# Patient Record
Sex: Female | Born: 1970 | Hispanic: Yes | Marital: Married | Smoking: Former smoker
Health system: Southern US, Community
[De-identification: ages and names within clinical notes are randomized; demographics above are authoritative.]

## PROBLEM LIST (undated history)

## (undated) DIAGNOSIS — F1421 Cocaine dependence, in remission: Secondary | ICD-10-CM

## (undated) DIAGNOSIS — J45909 Unspecified asthma, uncomplicated: Secondary | ICD-10-CM

## (undated) DIAGNOSIS — I10 Essential (primary) hypertension: Secondary | ICD-10-CM

## (undated) DIAGNOSIS — G5791 Unspecified mononeuropathy of right lower limb: Secondary | ICD-10-CM

## (undated) DIAGNOSIS — R5383 Other fatigue: Secondary | ICD-10-CM

## (undated) DIAGNOSIS — K649 Unspecified hemorrhoids: Secondary | ICD-10-CM

## (undated) DIAGNOSIS — G4730 Sleep apnea, unspecified: Secondary | ICD-10-CM

## (undated) DIAGNOSIS — J302 Other seasonal allergic rhinitis: Secondary | ICD-10-CM

## (undated) DIAGNOSIS — I517 Cardiomegaly: Secondary | ICD-10-CM

## (undated) DIAGNOSIS — R011 Cardiac murmur, unspecified: Secondary | ICD-10-CM

## (undated) DIAGNOSIS — G43909 Migraine, unspecified, not intractable, without status migrainosus: Secondary | ICD-10-CM

## (undated) DIAGNOSIS — F419 Anxiety disorder, unspecified: Secondary | ICD-10-CM

## (undated) DIAGNOSIS — K219 Gastro-esophageal reflux disease without esophagitis: Secondary | ICD-10-CM

## (undated) DIAGNOSIS — O24419 Gestational diabetes mellitus in pregnancy, unspecified control: Secondary | ICD-10-CM

## (undated) DIAGNOSIS — N979 Female infertility, unspecified: Secondary | ICD-10-CM

## (undated) HISTORY — DX: Essential (primary) hypertension: I10

## (undated) HISTORY — DX: Unspecified asthma, uncomplicated: J45.909

## (undated) HISTORY — DX: Gastro-esophageal reflux disease without esophagitis: K21.9

## (undated) HISTORY — DX: Unspecified hemorrhoids: K64.9

## (undated) HISTORY — DX: Cardiac murmur, unspecified: R01.1

## (undated) HISTORY — DX: Other fatigue: R53.83

## (undated) HISTORY — DX: Sleep apnea, unspecified: G47.30

## (undated) HISTORY — DX: Cocaine dependence, in remission: F14.21

## (undated) HISTORY — DX: Other seasonal allergic rhinitis: J30.2

## (undated) HISTORY — DX: Gestational diabetes mellitus in pregnancy, unspecified control: O24.419

## (undated) HISTORY — DX: Anxiety disorder, unspecified: F41.9

## (undated) HISTORY — PX: LIPOSUCTION: SHX10

## (undated) HISTORY — DX: Migraine, unspecified, not intractable, without status migrainosus: G43.909

## (undated) HISTORY — PX: MOUTH SURGERY: SHX715

## (undated) HISTORY — DX: Female infertility, unspecified: N97.9

## (undated) HISTORY — PX: FOOT SURGERY: SHX648

---

## 1998-06-05 HISTORY — PX: TUBAL LIGATION: SHX77

## 2003-04-10 ENCOUNTER — Other Ambulatory Visit: Admission: RE | Admit: 2003-04-10 | Discharge: 2003-04-10 | Payer: Self-pay | Admitting: Family Medicine

## 2003-06-26 ENCOUNTER — Encounter: Admission: RE | Admit: 2003-06-26 | Discharge: 2003-06-26 | Payer: Self-pay | Admitting: Family Medicine

## 2003-07-03 ENCOUNTER — Encounter: Admission: RE | Admit: 2003-07-03 | Discharge: 2003-07-03 | Payer: Self-pay | Admitting: Family Medicine

## 2003-07-03 ENCOUNTER — Encounter (INDEPENDENT_AMBULATORY_CARE_PROVIDER_SITE_OTHER): Payer: Self-pay | Admitting: Specialist

## 2003-07-03 ENCOUNTER — Other Ambulatory Visit: Admission: RE | Admit: 2003-07-03 | Discharge: 2003-07-03 | Payer: Self-pay | Admitting: Family Medicine

## 2003-07-17 ENCOUNTER — Encounter: Admission: RE | Admit: 2003-07-17 | Discharge: 2003-07-17 | Payer: Self-pay | Admitting: Family Medicine

## 2003-08-16 ENCOUNTER — Emergency Department (HOSPITAL_COMMUNITY): Admission: EM | Admit: 2003-08-16 | Discharge: 2003-08-17 | Payer: Self-pay | Admitting: Emergency Medicine

## 2003-08-21 ENCOUNTER — Encounter: Admission: RE | Admit: 2003-08-21 | Discharge: 2003-08-21 | Payer: Self-pay | Admitting: Nurse Practitioner

## 2004-07-15 ENCOUNTER — Ambulatory Visit: Payer: Self-pay | Admitting: Nurse Practitioner

## 2004-07-26 ENCOUNTER — Ambulatory Visit: Payer: Self-pay | Admitting: Nurse Practitioner

## 2004-07-26 ENCOUNTER — Other Ambulatory Visit: Admission: RE | Admit: 2004-07-26 | Discharge: 2004-07-26 | Payer: Self-pay | Admitting: Family Medicine

## 2004-12-08 ENCOUNTER — Emergency Department (HOSPITAL_COMMUNITY): Admission: EM | Admit: 2004-12-08 | Discharge: 2004-12-08 | Payer: Self-pay | Admitting: Family Medicine

## 2005-01-27 ENCOUNTER — Ambulatory Visit: Payer: Self-pay | Admitting: Nurse Practitioner

## 2005-06-16 ENCOUNTER — Ambulatory Visit: Payer: Self-pay | Admitting: Nurse Practitioner

## 2005-08-21 ENCOUNTER — Other Ambulatory Visit: Admission: RE | Admit: 2005-08-21 | Discharge: 2005-08-21 | Payer: Self-pay | Admitting: Family Medicine

## 2005-08-21 ENCOUNTER — Ambulatory Visit: Payer: Self-pay | Admitting: Nurse Practitioner

## 2005-08-22 ENCOUNTER — Ambulatory Visit (HOSPITAL_COMMUNITY): Admission: RE | Admit: 2005-08-22 | Discharge: 2005-08-22 | Payer: Self-pay | Admitting: Nurse Practitioner

## 2005-09-21 ENCOUNTER — Ambulatory Visit: Payer: Self-pay | Admitting: Nurse Practitioner

## 2005-10-23 ENCOUNTER — Ambulatory Visit: Payer: Self-pay | Admitting: Nurse Practitioner

## 2005-12-04 ENCOUNTER — Ambulatory Visit: Payer: Self-pay | Admitting: Nurse Practitioner

## 2005-12-08 ENCOUNTER — Ambulatory Visit (HOSPITAL_COMMUNITY): Admission: RE | Admit: 2005-12-08 | Discharge: 2005-12-08 | Payer: Self-pay | Admitting: Family Medicine

## 2006-05-31 ENCOUNTER — Emergency Department (HOSPITAL_COMMUNITY): Admission: EM | Admit: 2006-05-31 | Discharge: 2006-06-01 | Payer: Self-pay | Admitting: Emergency Medicine

## 2006-08-21 ENCOUNTER — Ambulatory Visit: Payer: Self-pay | Admitting: Nurse Practitioner

## 2006-08-23 ENCOUNTER — Ambulatory Visit: Payer: Self-pay | Admitting: Nurse Practitioner

## 2006-08-26 ENCOUNTER — Emergency Department (HOSPITAL_COMMUNITY): Admission: EM | Admit: 2006-08-26 | Discharge: 2006-08-26 | Payer: Self-pay | Admitting: Emergency Medicine

## 2006-12-14 ENCOUNTER — Ambulatory Visit: Payer: Self-pay | Admitting: Internal Medicine

## 2007-04-25 ENCOUNTER — Other Ambulatory Visit: Admission: RE | Admit: 2007-04-25 | Discharge: 2007-04-25 | Payer: Self-pay | Admitting: Family Medicine

## 2007-04-25 ENCOUNTER — Encounter (INDEPENDENT_AMBULATORY_CARE_PROVIDER_SITE_OTHER): Payer: Self-pay | Admitting: Nurse Practitioner

## 2007-04-25 ENCOUNTER — Ambulatory Visit: Payer: Self-pay | Admitting: Family Medicine

## 2007-04-25 LAB — CONVERTED CEMR LAB
ALT: 17 units/L (ref 0–35)
AST: 17 units/L (ref 0–37)
Albumin: 4.3 g/dL (ref 3.5–5.2)
Alkaline Phosphatase: 65 units/L (ref 39–117)
BUN: 12 mg/dL (ref 6–23)
Basophils Absolute: 0 10*3/uL (ref 0.0–0.1)
Basophils Relative: 1 % (ref 0–1)
CO2: 23 meq/L (ref 19–32)
Calcium: 9.1 mg/dL (ref 8.4–10.5)
Chloride: 105 meq/L (ref 96–112)
Creatinine, Ser: 0.76 mg/dL (ref 0.40–1.20)
Eosinophils Absolute: 0.1 10*3/uL — ABNORMAL LOW (ref 0.2–0.7)
Eosinophils Relative: 2 % (ref 0–5)
Glucose, Bld: 74 mg/dL (ref 70–99)
HCT: 41.3 % (ref 36.0–46.0)
Hemoglobin: 13 g/dL (ref 12.0–15.0)
Lymphocytes Relative: 33 % (ref 12–46)
Lymphs Abs: 2.1 10*3/uL (ref 0.7–4.0)
MCHC: 31.5 g/dL (ref 30.0–36.0)
MCV: 92.2 fL (ref 78.0–100.0)
Monocytes Absolute: 0.4 10*3/uL (ref 0.1–1.0)
Monocytes Relative: 6 % (ref 3–12)
Neutro Abs: 3.7 10*3/uL (ref 1.7–7.7)
Neutrophils Relative %: 58 % (ref 43–77)
Platelets: 359 10*3/uL (ref 150–400)
Potassium: 4.1 meq/L (ref 3.5–5.3)
RBC: 4.48 M/uL (ref 3.87–5.11)
RDW: 12.5 % (ref 11.5–15.5)
Sodium: 140 meq/L (ref 135–145)
TSH: 0.692 microintl units/mL (ref 0.350–5.50)
Total Bilirubin: 0.3 mg/dL (ref 0.3–1.2)
Total Protein: 7.4 g/dL (ref 6.0–8.3)
WBC: 6.4 10*3/uL (ref 4.0–10.5)

## 2007-04-26 ENCOUNTER — Encounter (INDEPENDENT_AMBULATORY_CARE_PROVIDER_SITE_OTHER): Payer: Self-pay | Admitting: Nurse Practitioner

## 2008-03-31 ENCOUNTER — Emergency Department (HOSPITAL_COMMUNITY): Admission: EM | Admit: 2008-03-31 | Discharge: 2008-03-31 | Payer: Self-pay | Admitting: Emergency Medicine

## 2009-06-05 HISTORY — PX: FLUOROSCOPIC TUBAL RECANNULATUON: SHX6654

## 2009-08-31 ENCOUNTER — Telehealth: Payer: Self-pay | Admitting: Psychology

## 2009-08-31 ENCOUNTER — Ambulatory Visit: Payer: Self-pay | Admitting: Family Medicine

## 2009-08-31 DIAGNOSIS — G4700 Insomnia, unspecified: Secondary | ICD-10-CM

## 2009-08-31 DIAGNOSIS — J45909 Unspecified asthma, uncomplicated: Secondary | ICD-10-CM | POA: Insufficient documentation

## 2009-09-08 ENCOUNTER — Encounter: Payer: Self-pay | Admitting: Family Medicine

## 2009-09-08 ENCOUNTER — Telehealth: Payer: Self-pay | Admitting: Psychology

## 2009-09-08 ENCOUNTER — Ambulatory Visit: Payer: Self-pay | Admitting: Family Medicine

## 2009-09-08 ENCOUNTER — Other Ambulatory Visit: Admission: RE | Admit: 2009-09-08 | Discharge: 2009-09-08 | Payer: Self-pay | Admitting: Family Medicine

## 2009-09-08 DIAGNOSIS — K649 Unspecified hemorrhoids: Secondary | ICD-10-CM | POA: Insufficient documentation

## 2009-09-08 DIAGNOSIS — E6609 Other obesity due to excess calories: Secondary | ICD-10-CM

## 2009-09-08 LAB — CONVERTED CEMR LAB: Pap Smear: NEGATIVE

## 2009-09-12 ENCOUNTER — Encounter: Payer: Self-pay | Admitting: Family Medicine

## 2009-09-22 ENCOUNTER — Ambulatory Visit: Payer: Self-pay | Admitting: Family Medicine

## 2009-09-22 ENCOUNTER — Emergency Department (HOSPITAL_COMMUNITY): Admission: EM | Admit: 2009-09-22 | Discharge: 2009-09-22 | Payer: Self-pay | Admitting: Emergency Medicine

## 2009-09-23 ENCOUNTER — Encounter: Payer: Self-pay | Admitting: Family Medicine

## 2009-09-29 ENCOUNTER — Encounter: Payer: Self-pay | Admitting: Family Medicine

## 2009-09-29 ENCOUNTER — Ambulatory Visit: Payer: Self-pay | Admitting: Family Medicine

## 2009-09-29 DIAGNOSIS — F1421 Cocaine dependence, in remission: Secondary | ICD-10-CM

## 2009-09-29 DIAGNOSIS — F4310 Post-traumatic stress disorder, unspecified: Secondary | ICD-10-CM

## 2009-09-29 HISTORY — DX: Cocaine dependence, in remission: F14.21

## 2009-09-29 LAB — CONVERTED CEMR LAB
ALT: 11 units/L (ref 0–35)
AST: 15 units/L (ref 0–37)
Albumin: 4 g/dL (ref 3.5–5.2)
Alkaline Phosphatase: 65 units/L (ref 39–117)
BUN: 10 mg/dL (ref 6–23)
CO2: 24 meq/L (ref 19–32)
Calcium: 8.9 mg/dL (ref 8.4–10.5)
Chloride: 102 meq/L (ref 96–112)
Cholesterol: 169 mg/dL (ref 0–200)
Creatinine, Ser: 0.69 mg/dL (ref 0.40–1.20)
Glucose, Bld: 121 mg/dL — ABNORMAL HIGH (ref 70–99)
HCT: 38.3 % (ref 36.0–46.0)
HDL: 40 mg/dL (ref >39)
Hemoglobin: 12.5 g/dL (ref 12.0–15.0)
LDL Cholesterol: 92 mg/dL (ref 0–99)
MCHC: 32.6 g/dL (ref 30.0–36.0)
MCV: 91 fL (ref 78.0–100.0)
Platelets: 338 10*3/uL (ref 150–400)
Potassium: 3.8 meq/L (ref 3.5–5.3)
RBC: 4.21 M/uL (ref 3.87–5.11)
RDW: 12.9 % (ref 11.5–15.5)
Sodium: 139 meq/L (ref 135–145)
TSH: 1.04 microintl units/mL (ref 0.350–4.500)
Total Bilirubin: 0.2 mg/dL — ABNORMAL LOW (ref 0.3–1.2)
Total CHOL/HDL Ratio: 4.2
Total Protein: 7 g/dL (ref 6.0–8.3)
Triglycerides: 185 mg/dL — ABNORMAL HIGH (ref <150)
VLDL: 37 mg/dL (ref 0–40)
WBC: 5.1 10*3/uL (ref 4.0–10.5)

## 2009-10-04 ENCOUNTER — Emergency Department (HOSPITAL_COMMUNITY): Admission: EM | Admit: 2009-10-04 | Discharge: 2009-10-04 | Payer: Self-pay | Admitting: Family Medicine

## 2009-10-06 ENCOUNTER — Telehealth: Payer: Self-pay | Admitting: Psychology

## 2009-10-06 ENCOUNTER — Telehealth: Payer: Self-pay | Admitting: Family Medicine

## 2009-10-06 ENCOUNTER — Encounter: Payer: Self-pay | Admitting: Family Medicine

## 2009-10-13 ENCOUNTER — Encounter: Payer: Self-pay | Admitting: Psychology

## 2009-10-13 ENCOUNTER — Encounter: Payer: Self-pay | Admitting: Family Medicine

## 2009-10-14 ENCOUNTER — Telehealth (INDEPENDENT_AMBULATORY_CARE_PROVIDER_SITE_OTHER): Payer: Self-pay | Admitting: *Deleted

## 2009-10-14 ENCOUNTER — Encounter: Payer: Self-pay | Admitting: *Deleted

## 2009-10-20 ENCOUNTER — Telehealth: Payer: Self-pay | Admitting: Psychology

## 2009-10-27 ENCOUNTER — Encounter: Payer: Self-pay | Admitting: Psychology

## 2009-11-03 ENCOUNTER — Ambulatory Visit: Payer: Self-pay | Admitting: Psychology

## 2009-11-10 ENCOUNTER — Encounter: Payer: Self-pay | Admitting: Psychology

## 2009-11-24 ENCOUNTER — Telehealth: Payer: Self-pay | Admitting: Family Medicine

## 2009-11-24 ENCOUNTER — Encounter: Payer: Self-pay | Admitting: Psychology

## 2009-12-01 ENCOUNTER — Encounter: Payer: Self-pay | Admitting: Psychology

## 2009-12-15 ENCOUNTER — Ambulatory Visit: Payer: Self-pay | Admitting: Family Medicine

## 2009-12-15 ENCOUNTER — Encounter: Payer: Self-pay | Admitting: Family Medicine

## 2009-12-15 ENCOUNTER — Encounter: Payer: Self-pay | Admitting: Psychology

## 2009-12-15 LAB — CONVERTED CEMR LAB
Bilirubin Urine: NEGATIVE
Blood in Urine, dipstick: NEGATIVE
Glucose, Urine, Semiquant: NEGATIVE
Ketones, urine, test strip: NEGATIVE
Nitrite: NEGATIVE
Specific Gravity, Urine: >=1.03
Urobilinogen, UA: 0.2
WBC Urine, dipstick: NEGATIVE
Whiff Test: NEGATIVE
pH: 5.5

## 2009-12-20 ENCOUNTER — Telehealth: Payer: Self-pay | Admitting: Family Medicine

## 2009-12-20 ENCOUNTER — Telehealth: Payer: Self-pay | Admitting: *Deleted

## 2010-01-06 ENCOUNTER — Encounter: Payer: Self-pay | Admitting: Psychology

## 2010-01-06 ENCOUNTER — Encounter: Payer: Self-pay | Admitting: Family Medicine

## 2010-01-12 ENCOUNTER — Ambulatory Visit: Payer: Self-pay | Admitting: Family Medicine

## 2010-03-10 ENCOUNTER — Emergency Department (HOSPITAL_COMMUNITY): Admission: EM | Admit: 2010-03-10 | Discharge: 2010-03-11 | Payer: Self-pay | Admitting: Emergency Medicine

## 2010-03-22 ENCOUNTER — Telehealth: Payer: Self-pay | Admitting: *Deleted

## 2010-03-23 ENCOUNTER — Encounter: Payer: Self-pay | Admitting: Family Medicine

## 2010-03-30 ENCOUNTER — Telehealth: Payer: Self-pay | Admitting: Psychology

## 2010-04-04 ENCOUNTER — Telehealth: Payer: Self-pay | Admitting: Psychology

## 2010-04-05 ENCOUNTER — Ambulatory Visit: Payer: Self-pay | Admitting: Family Medicine

## 2010-04-05 DIAGNOSIS — F4321 Adjustment disorder with depressed mood: Secondary | ICD-10-CM

## 2010-04-15 ENCOUNTER — Ambulatory Visit: Payer: Self-pay | Admitting: Family Medicine

## 2010-07-05 NOTE — Progress Notes (Signed)
Summary: needs note  Phone Note Call from Patient Call back at Home Phone [phone]   Caller: Patient Summary of Call: needs something on letter head stating that she is being seen by Dr Pascal Lux and is continuing care with her. needs this by tomorrow if at all possible. Initial call taken by: De Nurse,  Oct 14, 2009 3:02 PM  Follow-up for Phone Call        patient notified that note is ready for pick up. Follow-up by: Theresia Lo RN,  Oct 14, 2009 3:37 PM
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
## 2010-07-05 NOTE — Progress Notes (Signed)
Summary: Reschedule therapy appt  Phone Note Call from Patient   Caller: Patient Call For: [name] [place] Student) Summary of Call: Michelle Trevino called the [facility] clinic and left a message to cancel her appt with Student Therapist [name].  They rescheduled for the 25th. Initial call taken by: Spero Geralds PsyD,  Oct 20, 2009 1:45 PM
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
## 2010-07-05 NOTE — Letter (Signed)
Summary: Generic Letter  [facility]  [address]   [place], [place] [address]   Phone: [phone]  Fax: [phone]    10/14/2009  [facility] [address] [place], [place]  [address]   To Whom it may Concern :        This is to verify that the above named patient is under the care of Dr.Michelle Pascal Lux, Warden/ranger, at [facility]. A follow up appointment is scheduled.           Sincerely,   Theresia Lo RN

## 2010-07-05 NOTE — Assessment & Plan Note (Signed)
Summary: [facility] Follow-up   Primary Care Provider:  Delbert Harness MD   History of Present Illness: See A/P.  Allergies: No Known Drug Allergies   Impression & Recommendations:  Problem # 1:  PTSD (ICD-309.81) Patient had an appt scheduled with her therapist and with [facility] at the same time.  She met with her therapist.  She was not started on any medicine during her initial [facility] so there were no med issues to follow.  Will discuss with her therapist and call the patient to reschedule if medication is desired / warranted. Orders: Provider Misc Charge- [facility] (Misc)  Complete Medication List: 1)  Ventolin Hfa 108 (90 Base) Mcg/act Aers (Albuterol sulfate) .... 2 puffs every 4 hours as needed for sob or wheezing. 2)  Butalbital-apap-caffeine 50-325-40 Mg Caps (Butalbital-apap-caffeine) .... One tab every 6 hours as needed for migraine 3)  Sumatriptan Succinate 50 Mg Tabs (Sumatriptan succinate) .... Take one tablet at the start of migraine headache.  may repeat once in 2 hours if needed. 4)  Naprosyn 500 Mg Tabs (Naproxen) .... One tab by mouth two times a day as needed for shoulder pain.

## 2010-07-05 NOTE — Progress Notes (Signed)
  Phone Note Outgoing Call   Call placed by: Jimmy Footman, CMA,  December 20, 2009 10:20 AM Summary of Call: Called pt. and informed her that no bacteria was found in urine. Marland KitchenJimmy Footman, CMA  December 20, 2009 10:22 AM

## 2010-07-05 NOTE — Progress Notes (Signed)
Summary: phn msg  Phone Note Call from Patient Call back at Home Phone [phone]   Caller: Patient Summary of Call: pt is trying to get pregnant and is on her period now - wants to know if she can get a rx for Clomid for the 3rd day of cycle to induce ovulation [facility] [address] Initial call taken by: De Nurse,  March 22, 2010 1:43 PM  Follow-up for Phone Call        i do not prescribe fertility medications.  I would be open to speaking with patient about need for referral.  Please have her schedule office visit if she would like to discuss further. Follow-up by: Delbert Harness MD,  March 22, 2010 5:30 PM  Additional Follow-up for Phone Call Additional follow up Details #1::        spoke with pt and she will call back and schedule an appt to speak with pcp Additional Follow-up by: Loralee Pacas CMA,  March 23, 2010 10:34 AM
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
## 2010-07-05 NOTE — Miscellaneous (Signed)
Summary: lab request from [facility]  Clinical Lists Changes rec'd a faxed request for stat labs from [facility]. I called & LM for pt to call me back. will need to be seen 1st as we do not usually do labs for other providers & her last OV here was in july & not for labs.Marland KitchenMarland KitchenGolden Circle RN  January 06, 2010 4:01 PM  she just called back. having suregery on 8/12. they need these labs done on the 19th & faxed on the 11th so the surgery can happen on the 12th. told pt to keep her appt here on the 9th & ask md to order the labs. if agreeable, she can then come back in the next am to get this done.form placed in pcp chart box.Golden Circle RN  January 06, 2010 4:03 PM  Labwork requested to be drawn on 01/12/10 for surgery on the 12th.  They would like Quant bHCG and CBC.  I am anot aware of any surgery.  I will discuss with patient when I see her at her appt. Delbert Harness MD  January 10, 2010 12:26 PM  Lab work was done at [facility] prior to the visit today. Pt understood that we do not order labs for other MD's and so she made an effort to ask the OB/GYN group to send her orders to [facility].  Jamie Brookes MD  January 12, 2010 10:17 AM

## 2010-07-05 NOTE — Assessment & Plan Note (Signed)
Summary: BV   Vital Signs:  Patient profile:   40 year old female Height:      61 inches Weight:      217.4 pounds BMI:     41.23 Temp:     99.5 degrees F oral Pulse rate:   73 / minute Pulse rhythm:   regular BP sitting:   119 / 87  (left arm) Cuff size:   large  Vitals Entered By: Loralee Pacas CMA (December 15, 2009 11:29 AM) CC: vaginal itching.  Comments  pt has has external itching and burning x 1 week.  stated that she used a cheap hair removal cream, and another type of soap.  she has no d/c just extreme itching. also she would like an RX for vaniqa cream   Primary Care Provider:  Delbert Harness MD  CC:  vaginal itching. Marland Kitchen  History of Present Illness: Vaginal Itching: pt has had some vaginal itching recently. She is a homosexual female in a stable relationship with no h/o STI's. She recently used a cheap hair removal lotion from the dollar store on the vaginal area. She had lots of redness and irritation at that time but then it cleared up. Recently she had her wisdom teeth extracted and is currently on antibiotics. She says it burns when she urinates and it itches when she sweats. No discharge.   Habits & Providers  Alcohol-Tobacco-Diet     Tobacco Status: current     Cigarette Packs/Day: 0.5  Current Medications (verified): 1)  Ventolin Hfa 108 (90 Base) Mcg/act Aers (Albuterol Sulfate) .... 2 Puffs Every 4 Hours As Needed For Sob or Wheezing. 2)  Butalbital-Apap-Caffeine 50-325-40 Mg Caps (Butalbital-Apap-Caffeine) .... One Tab Every 6 Hours As Needed For Migraine 3)  Naprosyn 500 Mg Tabs (Naproxen) .... One Tab By Mouth Two Times A Day As Needed For Shoulder Pain. 4)  Ibuprofen 600 Mg  Tabs (Ibuprofen) .... One By Mouth Q6h.  Please Take Regularly 5)  Hydrocodone-Acetaminophen 5-500 Mg Tabs (Hydrocodone-Acetaminophen) .... One By Mouth Q6h As Needed Pain Not Relieved By Ibuprofen 6)  Vaniqa 13.9 % Crea (Eflornithine Hcl) .... Apply To Face Twice A Day 8 Hours Apart. Will  Take 4-8 Hours To Work   1 Large Tube 7)  Calna  Tabs (Prenatal Vitamins) .... Take 1 Tablet Daily  Allergies (verified): No Known Drug Allergies  Social History: Unemployed.  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  Lives with partner and her two kids.  (other two kids are 18 and 20 and out of the house)  Smoker since age 42, 1/2  PPD, raer Etoh, no illicit drug use.  Starting Zumba and going to gym once a week. Going to have a tubal ligation reversal at [facility] and planning to get pregnant.   Review of Systems        vitals reviewed and pertinent negatives and positives seen in HPI   Physical Exam  General:  Well-developed,well-nourished,in no acute distress; alert,appropriate and cooperative throughout examination Genitalia:  Normal introitus for age, no external lesions, minimal vaginal discharge, mucosa pink and moist, no vaginal or cervical lesions, no vaginal atrophy, no friaility or hemorrhage, normal uterus size and position, no adnexal masses or tenderness Psych:  Cognition and judgment appear intact. Alert and cooperative with normal attention span and concentration. No apparent delusions, illusions, hallucinations   Impression & Recommendations:  Problem # 1:  DYSURIA (ICD-788.1) Assessment New  Pt is having some  burning when she urinates. She has been on antibiotics. she has a negative uA.   Orders:  [facility]- Est Level  3 (16109)  Her updated medication list for this problem includes:    Metronidazole 500 Mg Tabs (Metronidazole) .Marland Kitchen... 1 tab po two times a day for 7 days  Problem # 2:  PRURITUS (ICD-698.9) Assessment: New Pt found to have BV on wet prep. This is likely causing the puritis.   Orders: Wet Prep- [facility] (87210) [facility]- Est Level  3 (60454)  Complete Medication List: 1)  Ventolin Hfa 108 (90 Base) Mcg/act Aers (Albuterol sulfate) .... 2 puffs every 4 hours as needed for sob or wheezing. 2)   Butalbital-apap-caffeine 50-325-40 Mg Caps (Butalbital-apap-caffeine) .... One tab every 6 hours as needed for migraine 3)  Naprosyn 500 Mg Tabs (Naproxen) .... One tab by mouth two times a day as needed for shoulder pain. 4)  Ibuprofen 600 Mg Tabs (Ibuprofen) .... One by mouth q6h.  please take regularly 5)  Hydrocodone-acetaminophen 5-500 Mg Tabs (Hydrocodone-acetaminophen) .... One by mouth q6h as needed pain not relieved by ibuprofen 6)  Vaniqa 13.9 % Crea (Eflornithine hcl) .... Apply to face twice a day 8 hours apart. will take 4-8 hours to work   1 large tube 7)  Calna Tabs (Prenatal vitamins) .... Take 1 tablet daily 8)  Metronidazole 500 Mg Tabs (Metronidazole) .Marland Kitchen.. 1 tab po two times a day for 7 days  Other Orders: Urinalysis-[facility] (00000) Urine Culture-[facility] ([id number])  Patient Instructions: 1)  You have bacterial vaginosis.  2)  Good luck on your upcoming surgery.  3)  I hope the lotion works well for you.  Prescriptions: METRONIDAZOLE 500 MG TABS (METRONIDAZOLE) 1 tab Po two times a day for 7 days  #14 x 0   Entered and Authorized by:   Jamie Brookes MD   Signed by:   Jamie Brookes MD on 12/15/2009   Method used:   Electronically to        [facility] [phone]* (retail)       [address]       [place], [place]  [address]       Ph: [phone]       Fax: [phone]   RxID:   [id number] CALNA  TABS (PRENATAL VITAMINS) take 1 tablet daily  #31 x 6   Entered and Authorized by:   Jamie Brookes MD   Signed by:   Jamie Brookes MD on 12/15/2009   Method used:   Electronically to        [facility] [phone]* (retail)       [address]       [place], [place]  [address]       Ph: [phone]       Fax: [phone]   RxID:   [id number] VANIQA 13.9 % CREA (EFLORNITHINE HCL) apply to face twice a day 8 hours apart. will take 4-8 hours to work   1 large tube  #1 x 5   Entered and Authorized by:   Jamie Brookes MD   Signed by:   Jamie Brookes MD on  12/15/2009   Method used:   Electronically to        [facility] [phone]* (retail)       [address]       [place], [place]  [address]       Ph: [phone]       Fax: [phone]  RxID:   [id number] BUTALBITAL-APAP-CAFFEINE 50-325-40 MG CAPS (BUTALBITAL-APAP-CAFFEINE) one tab every 6 hours as needed for migraine  #30 x 3   Entered and Authorized by:   Jamie Brookes MD   Signed by:   Jamie Brookes MD on 12/15/2009   Method used:   Electronically to        [facility] [phone]* (retail)       [address]       [place], [place]  [address]       Ph: [phone]       Fax: [phone]   RxID:   [id number] VENTOLIN HFA 108 (90 BASE) MCG/ACT AERS (ALBUTEROL SULFATE) 2 puffs every 4 hours as needed for SOB or wheezing.  #1 x 5   Entered and Authorized by:   Jamie Brookes MD   Signed by:   Jamie Brookes MD on 12/15/2009   Method used:   Electronically to        [facility] [phone]* (retail)       [address]       [place], [place]  [address]       Ph: [phone]       Fax: [phone]   RxID:   [id number]   Laboratory Results   Urine Tests  Date/Time Received: December 15, 2009 11:34 AM  Date/Time Reported: December 15, 2009 11:44 AM   Routine Urinalysis   Color: yellow Appearance: Clear Glucose: negative   (Normal Range: Negative) Bilirubin: negative   (Normal Range: Negative) Ketone: negative   (Normal Range: Negative) Spec. Gravity: >=1.030   (Normal Range: 1.003-1.035) Blood: negative   (Normal Range: Negative) pH: 5.5   (Normal Range: 5.0-8.0) Protein: trace   (Normal Range: Negative) Urobilinogen: 0.2   (Normal Range: 0-1) Nitrite: negative   (Normal Range: Negative) Leukocyte Esterace: negative   (Normal Range: Negative)    Comments: ...........test performed by...........Marland KitchenTerese Door, CMA  Date/Time Received: December 15, 2009 11:34 PM  Date/Time Reported: December 15, 2009 12:12 PM   [employer] Source: vaginal WBC/hpf:  1-5 Bacteria/hpf: 3+  Cocci Clue cells/hpf: few  Negative whiff Yeast/hpf: many Trichomonas/hpf: none Comments: rod bacteria present also...........test performed by...........Marland KitchenTerese Door, CMA

## 2010-07-05 NOTE — Assessment & Plan Note (Signed)
Summary: sore throat,tcb   Vital Signs:  Patient profile:   40 year old female Weight:      217 pounds Temp:     99.1 degrees F oral Pulse rate:   88 / minute Pulse rhythm:   regular BP sitting:   122 / 80  (left arm) Cuff size:   regular  Vitals Entered By: Loralee Pacas CMA (September 22, 2009 2:55 PM) CC: sore throat, right arm pain Comments pt stated that she was around a lot of pollen this week noticed that she had "welts" on her tounge.  woke up this am and right arm was swollen, burning, painful   Primary Care Provider:  Delbert Harness MD  CC:  sore throat and right arm pain.  History of Present Illness: 40 y/o female c/o  sore throat- started Saturday. went to [place]. no known h/o allergic rhinitis. has had itching of eyes, feeling of something draining down throat, sore throat. no fever, chills, rhinorrhea, headaches. tried one dose of singulair with no relief.   right arm pain- this a.m. woke her from sleep. burning, ?swollen. resolved without intervention. no prior history of similar symptoms. no focal neurologic symptoms.   Habits & Providers  Alcohol-Tobacco-Diet     Tobacco Status: current     Tobacco Counseling: to quit use of tobacco products     Cigarette Packs/Day: 0.5  Current Medications (verified): 1)  Ventolin Hfa 108 (90 Base) Mcg/act Aers (Albuterol Sulfate) .... 2 Puffs Every 4 Hours As Needed For Sob or Wheezing. 2)  Butalbital-Apap-Caffeine 50-325-40 Mg Caps (Butalbital-Apap-Caffeine) .... One Tab Every 6 Hours As Needed For Migraine 3)  Sumatriptan Succinate 50 Mg Tabs (Sumatriptan Succinate) .... Take One Tablet At The Start of Migraine Headache.  May Repeat Once in 2 Hours If Needed. 4)  Naprosyn 500 Mg Tabs (Naproxen) .... One Tab By Mouth Two Times A Day As Needed For Shoulder Pain.  Allergies (verified): No Known Drug Allergies  Physical Exam  General:  obese female, NAD. vitals reviewed.  Eyes:  PERRLA/EOM intact; conjunctiva and sclera  clear Ears:  TMs intact and clear with normal canals and hearing Nose:  no deformity, discharge, inflammation, or lesions Mouth:  postnasal drip.   Neck:  no masses, thyromegaly, or abnormal cervical nodes Lungs:  clear bilaterally to A & P Heart:  regular rate and rhythm, S1, S2 without murmurs, rubs, gallops, or clicks Neurologic:  CN II-XII grossly intact. no focal weakness. normal gait   Shoulder/Elbow Exam  Shoulder Exam:    Right:    Inspection:  Normal    Palpation:  Normal    Stability:  stable    Tenderness:  no    Swelling:  no    Erythema:  no    Range of Motion:       Flexion-Active: 180       Extension-Active: 45       Flexion-Passive: 180       Extension-Passive: 45       External Rotation : 45       Interior Rotation : T7    Left:    Inspection:  Normal    Palpation:  Normal    Stability:  stable    Tenderness:  no    Swelling:  no    Erythema:  no    Range of Motion:       Flexion-Active: 180       Extension-Active: 45       Flexion-Passive: 180  Extension-Passive: 45       External Rotation : 45       Interior Rotation : T7   Impression & Recommendations:  Problem # 1:  POSTNASAL DRIP SYNDROME (ICD-784.91)  encouraged patient to try OTC allergy medication (cetirizine, loratadine, allegra). f/u with PCP. exam/hx not consistent with infectious etiology such as viral or bacterial pharyngitis.  Orders: [facility]- Est  Level 4 (29528)  Problem # 2:  SHOULDER PAIN, RIGHT (ICD-719.41) Assessment: New  likely 2/2 malpositioning while sleeping causing inflammation; naproxen (see pt instructions). f/u with PCP with further w/u if persists/returns.   Her updated medication list for this problem includes:    Butalbital-apap-caffeine 50-325-40 Mg Caps (Butalbital-apap-caffeine) ..... One tab every 6 hours as needed for migraine    Naprosyn 500 Mg Tabs (Naproxen) ..... One tab by mouth two times a day as needed for shoulder pain.  Orders: [facility]- Est   Level 4 (41324)  Patient Instructions: 1)  For the next two days, take naproxen one tablet twice a day no matter what.  2)  Try LORATADINE or CETIRIZINE (claritin, zyrtec) for your sore throat.  3)  Follow up with Dr. Earnest Bailey in 7-10 days.  Prescriptions: NAPROSYN 500 MG TABS (NAPROXEN) one tab by mouth two times a day as needed for shoulder pain.  #60 x 0   Entered and Authorized by:   Lequita Asal  MD   Signed by:   Lequita Asal  MD on 09/22/2009   Method used:   Electronically to        [facility] [phone]* (retail)       [address]       [place], [place]  [address]       Ph: [phone]       Fax: [phone]   RxID:   [id number]

## 2010-07-05 NOTE — Miscellaneous (Signed)
Summary: ROI  ROI   Imported By: Bradly Bienenstock 09/08/2009 10:57:45  _____________________________________________________________________  External Attachment:    Type:   Image     Comment:   External Document
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
## 2010-07-05 NOTE — Miscellaneous (Signed)
Summary: Behavioral Medicine Care  Clinical Lists Changes  Patient was followed by [name], [facility] Doctoral level psychology studnent.  Katie's placement at [facility] has come to a close.  She has discussed options with Kara Dies and Tanaysia expressed interest in following with Katie at [facility].  This is possible with her insurance.  Would encourage PCP to get a release of information to continue coordinated care with Ms. Leighton Parody.

## 2010-07-05 NOTE — Assessment & Plan Note (Signed)
Summary: FLU SHOT/[facility]  Nurse Visit  FLU SHOT GIVEN TODAY.Jimmy Footman, CMA  April 15, 2010 11:00 AM  Vital Signs:  Patient profile:   40 year old female Temp:     98.7 degrees F oral  Vitals Entered By: Jimmy Footman, CMA (April 15, 2010 10:59 AM)  Allergies: No Known Drug Allergies  Immunizations Administered:  Influenza Vaccine # 1:    Vaccine Type: Fluvax 3+    Site: left deltoid    Mfr: GlaxoSmithKline    Dose: 0.5 ml    Route: IM    Given by: Jimmy Footman, CMA    Exp. Date: 12/03/2010    Lot #: [id number]    VIS given: 12/28/09 version given April 15, 2010.  Flu Vaccine Consent Questions:    Do you have a history of severe allergic reactions to this vaccine? no    Any prior history of allergic reactions to egg and/or gelatin? no    Do you have a sensitivity to the preservative Thimersol? no    Do you have a past history of Guillan-Barre Syndrome? no    Do you currently have an acute febrile illness? no    Have you ever had a severe reaction to latex? no    Vaccine information given and explained to patient? yes    Are you currently pregnant? no  Orders Added: 1)  Flu Vaccine 5yrs + [90658] 2)  Admin 1st Vaccine [82956]

## 2010-07-05 NOTE — Assessment & Plan Note (Signed)
Summary: pap/eo   Vital Signs:  Patient profile:   40 year old female Weight:      214.6 pounds Temp:     98.8 degrees F oral Pulse rate:   82 / minute Pulse rhythm:   regular BP sitting:   128 / 91  Vitals Entered By: Loralee Pacas CMA (September 08, 2009 2:34 PM)  Primary Care Provider:  Delbert Harness MD  CC:  Gynecological exam.  History of Present Illness: 40 yo here for annual gynecological exam.  LMP:  Last week Contraception: BTL Regular Menses: yes Hx of Anemia: no FHx of Breast, Uterine, Cervical or Ovarian Cancer:  maternal grandmother with unknonw "female cancer" Last Pap:  2 years ago, was normal. Hx of Abnormal Pap:  was abnormal and was told to come back in 6 months.  Had a colposcopy in 2007, never found out results.  Desires STD testing: No Last Mammogram:  6 years ago, dx as fibrocystic changes, Abnormalities on Self-exam:  No Hx of Abnormal Mammogram:  she assumes it was ok.   hemorhoids:  history of hemorrhoids after childbirth.  has not been a problem in many years.  notes soreness around rectum.  Thinks hemorrhoids may have returned.    PMH-FH-SH reviewed-no changes except otherwise noted  Review of Systems      See HPI General:  Denies fever and weight loss. GI:  Complains of hemorrhoids; denies bloody stools, change in bowel habits, and constipation. GU:  Denies abnormal vaginal bleeding, discharge, dysuria, and genital sores.  Physical Exam  General:  Obese,well-nourished,in no acute distress; alert,appropriate and cooperative throughout examination Rectal:  no anal fissure.  good rectal tone. No obvious thrombosed hemorrhoid on digital rectal exam.  small old extertnal anal skin tag, but no obvious external hemorrhoid Genitalia:  Pelvic Exam:        External: normal female genitalia without lesions or masses        Vagina: normal without lesions or masses        Cervix: normal without lesions or masses        Adnexa: normal bimanual exam  without masses or fullness        Uterus: normal by palpation        Pap smear: performed   Impression & Recommendations:  Problem # 1:  ROUTINE GYNECOLOGICAL EXAMINATION (ICD-V72.31)  pap today.  History of abnormal pap s/p colposcopy 3-4 years ago.  [facility] in 2008 [facility] was normal.  Will continue regular surveillance.  Normal risk for breast cancer.  BTL for contraception.  Regular periods.  Future Orders: CBC-[facility] ([address]) ... 09/23/2010  Problem # 2:  HEMORRHOIDS (ICD-455.6)  Not very botherwsome to patient at this time.  Advised symptomatic treatment with preparation H, tucks.  No obvious anal tear or thrombosed hemorrhoid.  If continues to have discomfort, will perform anoscopy to evaluate in more depth for hemorrhoids  Future Orders: CBC[facility] ([address]) ... 09/23/2010  Problem # 3:  Preventive Health Care (ICD-V70.0) will draw fasting labs before next appointment and plan to discuss at next appointment with patient.  Of particular concern to patient are metabolic issues concerning history of ovarian cysts, hair growth.  Will check fasting lipids, glucose.  Complete Medication List: 1)  Ventolin Hfa 108 (90 Base) Mcg/act Aers (Albuterol sulfate) .... 2 puffs every 4 hours as needed for sob or wheezing. 2)  Butalbital-apap-caffeine 50-325-40 Mg Caps (Butalbital-apap-caffeine) .... One tab every 6 hours as needed for migraine 3)  Sumatriptan Succinate  50 Mg Tabs (Sumatriptan succinate) .... Take one tablet at the start of migraine headache.  may repeat once in 2 hours if needed.  Other Orders: Pap Smear-[facility] ([id number]) Future Orders: Comp Met-[facility] ([id number]) ... 09/23/2010 Lipid-[facility] ([id number]) ... 09/24/2010  Patient Instructions: 1)  take a daily multivitamin 2)  make appointment for lab draw (no eating after midnight the night before) 3)  Make follow-up appt after your blood draw to go over results and to discuss migraines and asthma and make sure the  medications you were on from your last doctor are the right ones for you. 4)  May try fiber foods, keeping stools soft, preparation H.  If pain continues, return for mroe thourough exam to look for hemorrhoids.  Appended Document: Orders Update    Clinical Lists Changes  Orders: Added new Test order of [facility]  18-39 yrs [phone]) - Signed

## 2010-07-05 NOTE — Progress Notes (Signed)
Summary: Discuss MDC appt  Phone Note Call from Patient   Caller: Patient Call For: Spero Geralds, Psy.D. Summary of Call: Patient called to see if there had been any cancellations for [facility].  She is scheduled for April 27th and is struggling.  She denies suicidal ideation.  Has been to the point where she has needed hospitalization in the past and is not there now.  She feels better today than yesterday.  Given history of mood issues and treatment, Dr. Earnest Bailey recommended specialist care through [facility] and I agree with this.  Patient values this approach as well but would like to be seen sooner.  Discussed two-pronged approach to therapy and recommened contacting [facility] for therapy.  Patient has social support so is hesitant.  Allowed room for her to decline and she elected to take the number.  Merrianne would likely be able to see her in the next week whereas I couldn't see her for several (hence the referral).  I will call if we have a cancellation for [facility] for next week. Initial call taken by: Spero Geralds PsyD,  September 08, 2009 8:57 AM

## 2010-07-05 NOTE — Progress Notes (Signed)
Summary: MDC follow-up  Phone Note Outgoing Call   Call placed by: Spero Geralds, Psy.D. Call placed to: Patient Summary of Call: Called to clarify [facility] follow-up.  She is planning on June 1st at 10:00.  Also called to give her information abuot herapy.  The [employer] student that is working with me will likely see her in the [facility].  I will discuss with her today and she will follow-up with Tabathia.    Finally - told her that Dr. Earnest Bailey is on maternity leave.  If she wants follow-up of her lab work, she can call to schedule with a team member. Initial call taken by: Spero Geralds PsyD,  Oct 06, 2009 10:53 AM

## 2010-07-05 NOTE — Progress Notes (Signed)
Summary: Requesting return to therapy   Phone Note Call from Patient   Caller: Patient Call For: Spero Geralds, Psy.D. Summary of Call: Patient called wanting to get in touch with her student therapist, Florentina Addison.  I told her I would send an email to Katie (initials only) and ask Florentina Addison to call her to discuss options.  Would be best for her to follow at [facility] if possible since Florentina Addison has the relationship with her.  Will follow-up with Katie to ensure patient gets follow-up. Initial call taken by: Spero Geralds PsyD,  March 30, 2010 4:46 PM

## 2010-07-05 NOTE — Miscellaneous (Signed)
Summary: Asthma,  intermittant  Clinical Lists Changes  Problems: Changed problem from ASTHMA, UNSPECIFIED, UNSPECIFIED STATUS (ICD-493.90) to ASTHMA, INTERMITTENT (ICD-493.90)

## 2010-07-05 NOTE — Progress Notes (Signed)
Summary: meds prob  Phone Note Call from Patient Call back at Home Phone [phone]   Caller: Patient Summary of Call: was referred to oral surgeon and can't get in for consult until 6/28 -  pt is in a lot of pain and needs enough pains to get her thru - dentist will not give any more.  Initial call taken by: De Nurse,  November 24, 2009 2:21 PM  Follow-up for Phone Call        to pcp Follow-up by: Golden Circle RN,  November 24, 2009 2:53 PM  Additional Follow-up for Phone Call Additional follow up Details #1::        I would be willing to prescribe pain meds once for her dental pain.  Another provider is in her chart and I am unable to add medications.  I am not in clinic tomorrow- would you please have another resident/preceptor write:    Ibuprofen 600 mg one tablet every 6 hours scheduled # 28.   For breakthrough pain Vicodin 5/325  one tablet every 6 hours for severe pain  #28.  Additional Follow-up by: Delbert Harness MD,  November 25, 2009 12:28 AM    Additional Follow-up for Phone Call Additional follow up Details #2::    will give to preceptor. she uses [facility] on [address] Follow-up by: Golden Circle RN,  November 25, 2009 8:05 AM  Additional Follow-up for Phone Call Additional follow up Details #3:: Details for Additional Follow-up Action Taken: Done.  Ibuprofen sent electronically,  Vicodin to be faxed. Additional Follow-up by: Doralee Albino MD,  November 25, 2009 10:45 AM  New/Updated Medications: IBUPROFEN 600 MG  TABS (IBUPROFEN) one by mouth q6h.  Please take regularly HYDROCODONE-ACETAMINOPHEN 5-500 MG TABS (HYDROCODONE-ACETAMINOPHEN) one by mouth q6h as needed pain not relieved by ibuprofen Prescriptions: HYDROCODONE-ACETAMINOPHEN 5-500 MG TABS (HYDROCODONE-ACETAMINOPHEN) one by mouth q6h as needed pain not relieved by ibuprofen  #28 x 0   Entered and Authorized by:   Doralee Albino MD   Signed by:   Doralee Albino MD on 11/25/2009   Method used:   Printed then  faxed to ...       [facility]  [address] [phone]* (retail)       [address]       [place], [place]  [address]       Ph: [phone]       Fax: [phone]   RxID:   [id number] IBUPROFEN 600 MG  TABS (IBUPROFEN) one by mouth q6h.  Please take regularly  #28 x 0   Entered and Authorized by:   Doralee Albino MD   Signed by:   Doralee Albino MD on 11/25/2009   Method used:   Electronically to        [facility] [phone]* (retail)       [address]       [place], [place]  [address]       Ph: [phone]       Fax: [phone]   RxID:   [id number]  faxed.Golden Circle RN  November 25, 2009 10:54 AM

## 2010-07-05 NOTE — Miscellaneous (Signed)
Summary: ROI  ROI   Imported By: Bradly Bienenstock 09/08/2009 10:57:22  _____________________________________________________________________  External Attachment:    Type:   Image     Comment:   External Document

## 2010-07-05 NOTE — Miscellaneous (Signed)
Summary: Records from [facility]  Clinical Lists Changes  Observations: Added new observation of FLUVAXDUE: 07/03/2009 (09/23/2009 10:29) Added new observation of TDBOOSTDUE: 01/03/2018 (09/23/2009 10:29) Added new observation of PAP DUE: 09/09/2010 (09/23/2009 10:29) Added new observation of PAST SURG HX: BTL 2000 Right foot xray 12/13/2007: 2.5 mm plantar calcaneal spur. ECHO 08/06/08 EF 55%, Normal LV chamber size and normal wall motion.  No valvular abn PFT: 03/17/2008: No obstruction/no restriction, no sig change in airflow after inhaled bronchodilator. (09/23/2009 10:29) Added new observation of PAST MED HX: Anxiety: Dx in 2000, rare panic attacks Hx of suicide attempts at age 34- drug overdose. 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 GERD Migraines Obesity Vitamin D deficiency 12.3 on July 2009 Hx of Gestational Diabetes Hx of ECHO to evaluate "heart enlargement" [id number]- 5 TAB's  Pneumovax: 07-03-2008 TDAP 12-13-2007 (09/23/2009 10:29) Added new observation of PRIMARY MD: Delbert Harness MD (09/23/2009 10:29) Added new observation of FLU VAX: given (07/03/2008 11:03) Added new observation of TD BOOSTER: TDAP given (01/04/2008 11:02)       Past History:  Past Medical History: Anxiety: Dx in 2000, rare panic attacks Hx of suicide attempts at age 40- drug overdose. 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 GERD Migraines Obesity Vitamin D deficiency 12.3 on July 2009 Hx of Gestational Diabetes Hx of ECHO to evaluate "heart enlargement" [id number]- 5 TAB's  Pneumovax: 07-03-2008 TDAP 12-13-2007  Past Surgical History: BTL 2000 Right foot xray 12/13/2007: 2.5 mm plantar calcaneal spur. ECHO 08/06/08 EF 55%, Normal LV chamber size and normal wall motion.  No valvular abn PFT: 03/17/2008: No obstruction/no restriction, no sig change in airflow after inhaled bronchodilator.  Flu Vaccine Result Date:   07/03/2008 Flu Vaccine Result:  given Flu Vaccine Next Due:  1 yr TD Result Date:  01/04/2008 TD Result:  TDAP given TD Next Due:  10 yr

## 2010-07-05 NOTE — Progress Notes (Signed)
Summary: triage  Phone Note Call from Patient Call back at Home Phone [phone]   Caller: Patient Summary of Call: Pt is wanting to be checked for other stds does she need to see doctor or does she just get lab work.  Pt under inpression she only needs lab work. Initial call taken by: Clydell Hakim,  December 20, 2009 11:03 AM  Follow-up for Phone Call        was recently seen by Dr. Clotilde Dieter for BV. wants to see her again & be checked for all STDs. explained the HIV & syphylis is a blood test. she is off wed & wants 11am appt. to see Strother at that tim Follow-up by: Golden Circle RN,  December 20, 2009 11:11 AM

## 2010-07-05 NOTE — Letter (Signed)
Summary: Results Follow-up Letter  [facility]  [address]   [place], [place] [address]   Phone: [phone]  Fax: [phone]    09/12/2009  [address] [place], [place]  [address]  Dear Ms. TATE,   The following are the results of your recent test(s):  Test     Result     Pap Smear    Normal      Sincerely,  Delbert Harness MD [facility]           Appended Document: Results Follow-up Letter mailed.

## 2010-07-05 NOTE — Miscellaneous (Signed)
Summary: Consent for Psycotherapy Services  Consent for Psycotherapy Services   Imported By: De Nurse 11/03/2009 16:24:06  _____________________________________________________________________  External Attachment:    Type:   Image     Comment:   External Document

## 2010-07-05 NOTE — Letter (Signed)
Summary: Generic Letter  [facility]  [address]   [place], [place] [address]   Phone: [phone]  Fax: [phone]    10/06/2009  [facility] TATE [address] [place], [place]  [address]  Dear Ms. TATE,  Here is a copy of your lab results.  Everything looked good.  Tests: (1) CBC NO Diff (Complete Blood Count) (10000)   Order Note: FASTING   WBC                       5.1 K/uL                    4.0-10.5   RBC                       4.21 MIL/uL                 3.87-5.11   Hemoglobin                12.5 g/dL                   57.8-46.9   Hematocrit                38.3 %                      36.0-46.0   MCV                       91.0 fL                     78.0-100.0 ! MCH                       29.7 pg                     26.0-34.0   MCHC                      32.6 g/dL                   62.9-52.8   RDW                       12.9 %                      11.5-15.5   Platelet Count            338 K/uL                    150-400  Tests: (2) Comprehensive Metabolic Panel ([address])   Sodium                    139 mEq/L                   135-145   Potassium                 3.8 mEq/L                   3.5-5.3   Chloride                  102 mEq/L                   96-112  CO2                       24 mEq/L                    19-32   Glucose              [H]  121 mg/dL                   59-56   BUN                       10 mg/dL                    3-87   Creatinine                0.69 mg/dL                  0.40-1.20   Bilirubin, Total     [L]  0.2 mg/dL                   5.6-4.3   Alkaline Phosphatase      65 U/L                      39-117   AST/SGOT                  15 U/L                      0-37   ALT/SGPT                  11 U/L                      0-35   Total Protein             7.0 g/dL                    3.2-9.5   Albumin                   4.0 g/dL                    1.8-8.4   Calcium                   8.9 mg/dL                   1.6-60.6  Tests: (3) Lipid  Profile ([address])   Cholesterol               169 mg/dL                   1-093     ATP III Classification:           < 200        mg/dL        Desirable          200 - 239     mg/dL        Borderline High          >= 240        mg/dL        High         Triglyceride         [H]  185 mg/dL                   <  150   HDL Cholesterol           40 mg/dL                    >16   Total Chol/HDL Ratio      4.2 Ratio  VLDL Cholesterol (Calc)                             37 mg/dL                    1-09  LDL Cholesterol (Calc)                             92 mg/dL                    6-04           Total Cholesterol/HDL Ratio:CHD Risk                            Coronary Heart Disease Risk Table                                            Men       Women              1/2 Average Risk              3.4        3.3                  Average Risk              5.0        4.4              2 X Average Risk              9.6        7.1              3 X Average Risk             23.4       11.0     Use the calculated Patient Ratio above and the CHD Risk table      to determine the patient's CHD Risk.     ATP III Classification (LDL):           < 100        mg/dL         Optimal   Sincerely,   Angelena Sole MD  Appended Document: Generic Letter mailed

## 2010-07-05 NOTE — Assessment & Plan Note (Signed)
Summary: Behavioral Medicine Student Consultation   Vital Signs:  Patient profile:   40 year old female Weight:      217.4 pounds Cuff size:   regular   Primary Care Provider:  Delbert Harness MD   History of Present Illness: I met with Bular for an individual therapy session lasting approximately 45 minutes (arrived late).  Shamyra has a history of trauma, spanning from childhood to adulthood.    Habits & Providers  Alcohol-Tobacco-Diet     Tobacco Status: current     Tobacco Counseling: to quit use of tobacco products     Cigarette Packs/Day: 0.5  Allergies: No Known Drug Allergies   Impression & Recommendations:  Problem # 1:  PTSD (ICD-309.81) At the start of the session, Caraline talked about her upcoming reversal tubal ligation surgery.  She is still optimistic about the surgery and her chances of conceiving.  I suggested that we try to meet the week prior to her surgery given the recovery that will follow.  However, I reminded Kieran that we will have to meet at the [facility] given that I will no longer be interning at [employer].  I suggested that she stop by the clinic in the next couple of weeks to complete the necessary paperwork.  Jeyla reported doing well over the past 2 weeks, with no additional anxiety attacks.  However, we did talk about symptoms of that attack and how to better address it should she have another one.  The remainder of the session was spent talking about the readings from the Prolonged Exposure for PTSD workbook regarding real-life experiments.  Dmiyah was able to identify situations associated with ratings of 0, 5, and 10 on the stress/anxiety thermometer, but had a difficult time identifying scenarios for her exposure hierarchy.  I suggested that she talk about the traumas more in session, rating her anxiety as she talks, given that she does not feel that she is avoiding people, places, or things in daily life that remind her of the trauma.  The one  scenario that she was able to identify for her hierarchy was speaking to her mother about her part in the traumas that she endured in childhood.  I suggested that Alyzae gradually increase her time spent talking to her mother (about other topics) in an attempt to become more comfortable addressing issues with her and to increase her tolerance of her mother, who typically irritates Jeselle very easily.  Atira created a rating scale for irritability, in addition to her scale for anxiety, to use when speaking to her mother.  She said that she would try this "experiment" over the next week and record her ratings.   Armida Sans  Behavioral Medicine Student  December 15, 2009 11:27 AM  Complete Medication List: 1)  Ventolin Hfa 108 (90 Base) Mcg/act Aers (Albuterol sulfate) .... 2 puffs every 4 hours as needed for sob or wheezing. 2)  Butalbital-apap-caffeine 50-325-40 Mg Caps (Butalbital-apap-caffeine) .... One tab every 6 hours as needed for migraine 3)  Sumatriptan Succinate 50 Mg Tabs (Sumatriptan succinate) .... Take one tablet at the start of migraine headache.  may repeat once in 2 hours if needed. 4)  Naprosyn 500 Mg Tabs (Naproxen) .... One tab by mouth two times a day as needed for shoulder pain. 5)  Ibuprofen 600 Mg Tabs (Ibuprofen) .... One by mouth q6h.  please take regularly 6)  Hydrocodone-acetaminophen 5-500 Mg Tabs (Hydrocodone-acetaminophen) .... One by mouth q6h as needed pain not relieved by  ibuprofen    Appended Document: Behavioral Medicine Student Consultation Read note and discussed case with Student [name].  Made some suggestions about gently addressing some concerns about moving forward with attempts to get pregnant as Kara Dies and her partner plan.

## 2010-07-05 NOTE — Progress Notes (Signed)
Summary: Schedule MDC  Phone Note Call from Patient   Caller: Patient Call For: Spero Geralds, Psy.D. Summary of Call: Patient called to schedule an appt.  She was most interested in a medication evaluation so I [facility] in [facility].  First available was April 27th at 11:00.  I will call her if we have a cancellation earlier.   Initial call taken by: Spero Geralds PsyD,  August 31, 2009 4:26 PM

## 2010-07-05 NOTE — Assessment & Plan Note (Signed)
Summary: New Patient,Michelle Trevino   Vital Signs:  Patient profile:   40 year old female Weight:      216.3 pounds Temp:     99.7 degrees F oral Pulse rate:   97 / minute BP sitting:   127 / 84  (left arm) Cuff size:   regular  Vitals Entered By: San Morelle, SMA CC: NP   CC:  NP.  History of Present Illness: here to establish primary care:Her children and aprtner are patients here at [facility]/  Has been out of meds for past 3-4 months.  Main concern today is anxiety.  Anxiety: Dx in 2000, has had previous suicide attempt.  Describes symptoms as chronic worrying- having to get up and check to see if door is locked.  Has agoraphobia, notes having recurrent thoughts of morbid things, death of loved ones.   Does not feel depressed- able to function well, enjoys doing things, eatigng, sleep ok.  No longer Celexa, lexapro (teeth grinding) , Buspar, Xanax, wellbutrin.  She did not feel any of these were helpful.  Has been off most recent med- wellbutrin for several months and feels no different.     Habits & Providers  Alcohol-Tobacco-Diet     Tobacco Status: current     Cigarette Packs/Day: 0.5  Current Medications (verified): 1)  Ventolin Hfa 108 (90 Base) Mcg/act Aers (Albuterol Sulfate) .... 2 Puffs Every 4 Hours As Needed For Sob or Wheezing. 2)  Butalbital-Apap-Caffeine 50-325-40 Mg Caps (Butalbital-Apap-Caffeine) .... One Tab Every 6 Hours As Needed For Migraine 3)  Sumatriptan Succinate 50 Mg Tabs (Sumatriptan Succinate) .... Take One Tablet At The Start of Migraine Headache.  May Repeat Once in 2 Hours If Needed.  Past History:  Past Medical History: Anxiety: Dx in 2000, rare panic attacks Hx of suicide attempts at age 69- drug overdose. Asthma- Dx in 2000 in the military by P[employer] and received disability for it. GERD Migraines Vitamin D deficiency Hx of ECHO to evaluate "heart enlargement" [id number]- 5 TAB's  Past Surgical History: BTL 2000 PMH-FH-SH reviewed-no changes except  otherwise noted  Family History: Mom: HTN, DM, RLS, Acute hepatitis, pituitary adenoma?, osteoarthritis, stroke age 72. Dad: Unknown cancer; not much known Siblings: Brother stroke age 52  Social History: Unemployed.  Thinking about starting a job at an abortion clinic. In school at [employer] for public health.  Lives with partner and her two kids.  (other two kids are 18 and 20 and out of the house)  Smoker since age 6, 1/2  PPD, raer Etoh, no illicit drug use.  Starting Zumba and goign to gym once a week.Smoking Status:  current Packs/Day:  0.5  Review of Systems      See HPI General:  Denies fever and weight loss. Eyes:  Denies blurring. ENT:  Denies decreased hearing. CV:  Denies chest pain or discomfort, difficulty breathing at night, fainting, lightheadness, shortness of breath with exertion, and swelling of feet. Resp:  Denies cough, shortness of breath, and wheezing. GI:  Denies abdominal pain, bloody stools, change in bowel habits, constipation, diarrhea, nausea, and vomiting. GU:  Denies dysuria. Neuro:  Denies numbness and tingling. Psych:  Complains of anxiety; denies depression, panic attacks, and suicidal thoughts/plans.  Physical Exam  General:  Obese,well-nourished,in no acute distress; alert,appropriate and cooperative throughout examination Neck:  No deformities, masses, or tenderness noted. Lungs:  Normal respiratory effort, chest expands symmetrically. Lungs are clear to auscultation, no crackles or wheezes. Heart:  Normal rate and regular rhythm. S1 and  S2 normal without gallop, murmur, click, rub or other extra sounds. Abdomen:  Bowel sounds positive,abdomen soft and non-tender without masses, organomegaly or hernias noted. Extremities:  No clubbing, cyanosis, edema, or deformity noted with normal full range of motion of all joints.   Psych:  Cognition and judgment appear intact. Alert and cooperative with normal attention span and concentration. No apparent  delusions, illusions, hallucinations   Impression & Recommendations:  Problem # 1:  ANXIETY STATE, UNSPECIFIED (ICD-300.00) History unclear if this is primarily anxiety spectrum disorder or if component of depression.  given multiple medication failures, will refer to [facility] for further evaluation.  Problem # 2:  INSOMNIA (ICD-780.52) Likely anxiety is large component.  Discussed sleep hygeine, trying benadryl as she works on sleep hygeine.  Will discuss in more detail at future visit.  Problem # 3:  ASTHMA, UNSPECIFIED, UNSPECIFIED STATUS (ICD-493.90) Will explore in further detail at next visit.  Well controlled at this time.  Will refill albuterol.  Her updated medication list for this problem includes:    Ventolin Hfa 108 (90 Base) Mcg/act Aers (Albuterol sulfate) .Marland Kitchen... 2 puffs every 4 hours as needed for sob or wheezing.  Problem # 4:  MIGRAINE HEADACHE (ICD-346.90) Will refill patient's fiorcet and sumatriptan today.  Her updated medication list for this problem includes:    Butalbital-apap-caffeine 50-325-40 Mg Caps (Butalbital-apap-caffeine) ..... One tab every 6 hours as needed for migraine    Sumatriptan Succinate 50 Mg Tabs (Sumatriptan succinate) .Marland Kitchen... Take one tablet at the start of migraine headache.  may repeat once in 2 hours if needed.  Problem # 5:  Preventive Health Care (ICD-V70.0)  Complete Medication List: 1)  Ventolin Hfa 108 (90 Base) Mcg/act Aers (Albuterol sulfate) .... 2 puffs every 4 hours as needed for sob or wheezing. 2)  Butalbital-apap-caffeine 50-325-40 Mg Caps (Butalbital-apap-caffeine) .... One tab every 6 hours as needed for migraine 3)  Sumatriptan Succinate 50 Mg Tabs (Sumatriptan succinate) .... Take one tablet at the start of migraine headache.  may repeat once in 2 hours if needed.  Patient Instructions: 1)  Nice to meet you! 2)  Call Dr. Pascal Lux to talk about anxiety. 3)  Try benadryl available OTC while you work on "sleep hygeine" 4)  Make  appointment for Pap smear- gynecological exam 5)  Make appointment to follow-up on Asthma Prescriptions: SUMATRIPTAN SUCCINATE 50 MG TABS (SUMATRIPTAN SUCCINATE) take one tablet at the start of migraine headache.  May repeat once in 2 hours if needed.  #8 x 1   Entered and Authorized by:   Delbert Harness MD   Signed by:   Delbert Harness MD on 09/01/2009   Method used:   Electronically to        [facility] [address] [phone]* (retail)       [address]       [place], [place]  [address]       Ph: [phone]       Fax: [phone]   RxID:   [id number] BUTALBITAL-APAP-CAFFEINE 50-325-40 MG CAPS [facility]) one tab every 6 hours as needed for migraine  #28 x 0   Entered and Authorized by:   Delbert Harness MD   Signed by:   Delbert Harness MD on 09/01/2009   Method used:   Electronically to        [facility] [address] [phone]* (retail)       [address]       [place], [place]  [address]  Ph: [phone]       Fax: [phone]   RxID:   [id number] VENTOLIN HFA 108 (90 BASE) MCG/ACT AERS (ALBUTEROL SULFATE) 2 puffs every 4 hours as needed for SOB or wheezing.  #1 x 3   Entered and Authorized by:   Delbert Harness MD   Signed by:   Delbert Harness MD on 09/01/2009   Method used:   Electronically to        [facility] [address] [phone]* (retail)       [address]       [place], [place]  [address]       Ph: [phone]       Fax: [phone]   RxID:   [id number]   Flu Vaccine Result Date:  03/05/2009 Flu Vaccine Next Due:  1 yr
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
## 2010-07-05 NOTE — Assessment & Plan Note (Signed)
Summary: puritis resolved/pre-pregnancy visit   Vital Signs:  Patient profile:   40 year old female Weight:      219.4 pounds Temp:     98.9 degrees F oral Pulse rate:   84 / minute Pulse rhythm:   regular BP sitting:   119 / 84  (right arm) Cuff size:   large  Vitals Entered By: Loralee Pacas CMA (January 12, 2010 8:42 AM) CC: Puritis resolved   Primary Care Provider:  Delbert Harness MD  CC:  Puritis resolved.  History of Present Illness: Puritis resolved/Pre-Pregnancy Visit: Pt is having a tubal ligation reversal on 01-14-10 at [facility]. She is taking prenatal vitamins. Her and her partner are hoping to get pregnant this year.  Pt had gestational diabetes on insulin two times a day wtih CBG's 5x a day.  Pt advised to stop smoking and not use NSAIDs those during pregnancy. Pt plans to see her MD here while she is pregnant. Will need early GTT. No fevers, chills, all urinary symptoms from last visit gone including itching, no burning.   Habits & Providers  Alcohol-Tobacco-Diet     Tobacco Status: current     Tobacco Counseling: to quit use of tobacco products     Cigarette Packs/Day: 0.5  Current Medications (verified): 1)  Ventolin Hfa 108 (90 Base) Mcg/act Aers (Albuterol Sulfate) .... 2 Puffs Every 4 Hours As Needed For Sob or Wheezing. 2)  Butalbital-Apap-Caffeine 50-325-40 Mg Caps (Butalbital-Apap-Caffeine) .... One Tab Every 6 Hours As Needed For Migraine 3)  Naprosyn 500 Mg Tabs (Naproxen) .... One Tab By Mouth Two Times A Day As Needed For Shoulder Pain. 4)  Ibuprofen 600 Mg  Tabs (Ibuprofen) .... One By Mouth Q6h.  Please Take Regularly 5)  Calna  Tabs (Prenatal Vitamins) .... Take 1 Tablet Daily  Allergies (verified): No Known Drug Allergies  Review of Systems        vitals reviewed and pertinent negatives and positives seen in HPI   Physical Exam  General:  Well-developed,well-nourished,in no acute distress; alert,appropriate and cooperative  throughout examination Psych:  Cognition and judgment appear intact. Alert and cooperative with normal attention span and concentration. No apparent delusions, illusions, hallucinations   Impression & Recommendations:  Problem # 1:  PRURITUS (ICD-698.9) Assessment Improved Pt's symptoms have resolved. She is going to a tubal ligation reversal in 2 days. her prior symptoms have all resolved. she is planning to come here for OB visits. She is planning this pregnancy with her homosexual partner. Plan to use her partners female friend as a natural donor.   Orders: [facility]- Est Level  3 ([address]213)  Complete Medication List: 1)  Ventolin Hfa 108 (90 Base) Mcg/act Aers (Albuterol sulfate) .... 2 puffs every 4 hours as needed for sob or wheezing. 2)  Butalbital-apap-caffeine 50-325-40 Mg Caps (Butalbital-apap-caffeine) .... One tab every 6 hours as needed for migraine 3)  Naprosyn 500 Mg Tabs (Naproxen) .... One tab by mouth two times a day as needed for shoulder pain. 4)  Ibuprofen 600 Mg Tabs (Ibuprofen) .... One by mouth q6h.  please take regularly 5)  Calna Tabs (Prenatal vitamins) .... Take 1 tablet daily

## 2010-07-05 NOTE — Progress Notes (Signed)
Summary: Lab Res  Phone Note Call from Patient Call back at Home Phone [phone]   Caller: Patient Summary of Call: Checking on lab work from last week. Initial call taken by: Clydell Hakim,  Oct 06, 2009 11:06 AM  Follow-up for Phone Call        will forward to MD Follow-up by: Theresia Lo RN,  Oct 06, 2009 11:24 AM  Additional Follow-up for Phone Call Additional follow up Details #1::        Lab results were normal.  Will send letter with results Additional Follow-up by: Angelena Sole MD,  Oct 06, 2009 11:37 AM

## 2010-07-25 ENCOUNTER — Encounter: Payer: Self-pay | Admitting: Family Medicine

## 2010-07-25 ENCOUNTER — Ambulatory Visit (HOSPITAL_COMMUNITY)
Admission: RE | Admit: 2010-07-25 | Discharge: 2010-07-25 | Disposition: A | Discharge status: Home / Self Care | Payer: Medicaid Other | Source: Ambulatory Visit | Attending: Cardiovascular Disease | Admitting: Cardiovascular Disease

## 2010-07-25 ENCOUNTER — Ambulatory Visit (INDEPENDENT_AMBULATORY_CARE_PROVIDER_SITE_OTHER): Payer: Medicaid Other | Admitting: Family Medicine

## 2010-07-25 DIAGNOSIS — R079 Chest pain, unspecified: Secondary | ICD-10-CM | POA: Insufficient documentation

## 2010-07-25 DIAGNOSIS — R234 Changes in skin texture: Secondary | ICD-10-CM

## 2010-07-25 DIAGNOSIS — I499 Cardiac arrhythmia, unspecified: Secondary | ICD-10-CM | POA: Insufficient documentation

## 2010-07-25 DIAGNOSIS — I517 Cardiomegaly: Secondary | ICD-10-CM

## 2010-07-25 DIAGNOSIS — G4700 Insomnia, unspecified: Secondary | ICD-10-CM

## 2010-07-25 DIAGNOSIS — Z3169 Encounter for other general counseling and advice on procreation: Secondary | ICD-10-CM

## 2010-07-25 DIAGNOSIS — I10 Essential (primary) hypertension: Secondary | ICD-10-CM

## 2010-07-25 NOTE — Progress Notes (Signed)
  Subjective:    Patient ID: Michelle Trevino, female    DOB: [birth year], 40 y.o.   MRN: [id number]  HPI 1 month ago, had acute onset of "sharp knife stabbing pain in middle of chest" radiating to back while sitting at a desk.  Lasted for 20 minutes, worse with deep breaths.  Self resolved.  Went away for several weeks, now has been occuring daily for the past week for 10 minutes at a time.  Non-exertional, did Zumba class without problem.  Ok with walking on the treadmill 0 incline 3.5 MPH.  Associated with dizziness but dizziness also comes on without chest pain.  Feels like "head is spinning" not worse with positioning sometimes even not when standing up.  Not associated with nausea, headache although has frequent migraines.   Breast change:  Concerned about right breast with increasing dimple.  Has been present for many years, states at oen point did have a normal mammogram.  No mass, or nipple discharge.  Planning fertility:  Same sex partner, having sex on days she believes to be fertile with a female friend.  Had tube reversal several months ago.  Not taking prenatal vitamins.  Still drinking alcohol, smoking.  Review of SystemsAs per hpi     Objective:   Physical Exam  Constitutional: She appears well-developed and well-nourished.  Eyes: Pupils are equal, round, and reactive to light.  Neck: Normal range of motion. Neck supple.  Cardiovascular: Normal rate and regular rhythm.   No murmur heard. Pulmonary/Chest: Effort normal and breath sounds normal.  Genitourinary: No breast swelling, tenderness, discharge or bleeding.          Assessment & Plan:

## 2010-07-28 DIAGNOSIS — I10 Essential (primary) hypertension: Secondary | ICD-10-CM | POA: Insufficient documentation

## 2010-07-28 DIAGNOSIS — R234 Changes in skin texture: Secondary | ICD-10-CM | POA: Insufficient documentation

## 2010-07-28 DIAGNOSIS — I517 Cardiomegaly: Secondary | ICD-10-CM | POA: Insufficient documentation

## 2010-07-28 DIAGNOSIS — R079 Chest pain, unspecified: Secondary | ICD-10-CM | POA: Insufficient documentation

## 2010-07-28 NOTE — Assessment & Plan Note (Signed)
Patient notices deepening of indentation of right breast, but when comparing to left breast also has similar shape due to pull of skin and muscle.  Patient states she has had mammogram many years ago showing fibrocystic changes.  Will send for diagnostic mammogram for skin changes.

## 2010-07-28 NOTE — Assessment & Plan Note (Signed)
Prescribed ambien by another provider.  Advised limited episodic use and not for chronic use.  Did not refill today.  Discussed primarily focusing on underlying sleep habits and conditions rather than treating symptom

## 2010-07-28 NOTE — Assessment & Plan Note (Signed)
BP Readings from Last 3 Encounters:  07/25/10 145/92  04/05/10 145/88  01/12/10 119/84  patient now with 2 elevated blood pressure readings.  Given she is attempting to get pregnancy at this time will err on side of not prescribing medication.  Stressed importance on lifestyle changes not only for blood pressure but for possible pregnancy.  Will follow-up in 3-4 weeks and if continues to be elevated, will start labetalol.  Asked patient to keep BP log and bring to next appt.

## 2010-07-29 ENCOUNTER — Telehealth: Payer: Self-pay | Admitting: *Deleted

## 2010-07-29 ENCOUNTER — Other Ambulatory Visit: Payer: Self-pay | Admitting: Family Medicine

## 2010-07-29 DIAGNOSIS — R234 Changes in skin texture: Secondary | ICD-10-CM

## 2010-07-29 NOTE — Telephone Encounter (Signed)
LVM for patient to call back to inform of the diagnostic mammogram date. It is at the [facility]. 08/03/2010 @ 2:30pm. Patient to arrive 15 min early and no lotion or powders

## 2010-07-29 NOTE — Telephone Encounter (Signed)
Gave patient information about appt. And gave phone number to call in case of rescheduling or cancellation.Michelle Trevino, Michelle Trevino

## 2010-07-30 ENCOUNTER — Emergency Department (HOSPITAL_COMMUNITY)
Admission: EM | Admit: 2010-07-30 | Discharge: 2010-07-31 | Disposition: A | Discharge status: Home / Self Care | Payer: Medicaid Other | Attending: Emergency Medicine | Admitting: Emergency Medicine

## 2010-07-30 DIAGNOSIS — G43909 Migraine, unspecified, not intractable, without status migrainosus: Secondary | ICD-10-CM | POA: Insufficient documentation

## 2010-07-30 DIAGNOSIS — J45909 Unspecified asthma, uncomplicated: Secondary | ICD-10-CM | POA: Insufficient documentation

## 2010-07-30 DIAGNOSIS — R110 Nausea: Secondary | ICD-10-CM | POA: Insufficient documentation

## 2010-07-30 LAB — POCT PREGNANCY, URINE: Preg Test, Ur: NEGATIVE

## 2010-08-03 ENCOUNTER — Other Ambulatory Visit: Payer: Medicaid Other

## 2010-08-05 ENCOUNTER — Ambulatory Visit
Admission: RE | Admit: 2010-08-05 | Discharge: 2010-08-05 | Disposition: A | Discharge status: Home / Self Care | Payer: Medicaid Other | Source: Ambulatory Visit | Attending: Family Medicine | Admitting: Family Medicine

## 2010-08-05 DIAGNOSIS — R234 Changes in skin texture: Secondary | ICD-10-CM

## 2010-08-09 ENCOUNTER — Encounter: Payer: Self-pay | Admitting: Family Medicine

## 2010-08-09 ENCOUNTER — Ambulatory Visit (INDEPENDENT_AMBULATORY_CARE_PROVIDER_SITE_OTHER): Payer: Medicaid Other | Admitting: Family Medicine

## 2010-08-09 VITALS — BP 128/84 | HR 72 | Temp 99.0°F | Wt 228.5 lb

## 2010-08-09 DIAGNOSIS — E669 Obesity, unspecified: Secondary | ICD-10-CM

## 2010-08-09 DIAGNOSIS — I10 Essential (primary) hypertension: Secondary | ICD-10-CM

## 2010-08-09 DIAGNOSIS — G43909 Migraine, unspecified, not intractable, without status migrainosus: Secondary | ICD-10-CM

## 2010-08-09 MED ORDER — METOPROLOL TARTRATE 50 MG PO TABS: 50.0000 mg | ORAL_TABLET | Freq: Two times a day (BID) | ORAL | Status: DC

## 2010-08-09 MED ORDER — NAPROXEN 500 MG PO TABS: 500.0000 mg | ORAL_TABLET | Freq: Two times a day (BID) | ORAL | Status: DC

## 2010-08-09 NOTE — Assessment & Plan Note (Addendum)
Will start BB today for prophylaxis as headaches are nearly daily and she also needs BP control and a medication that is safe for pregnancy.  Discussed medication oversue headache which is a large component of her symptoms.  Will prescribe naprosyn, advised not to use more than twice a week and to start to taper down once metoprolol is helping.  Follow-up in 3-4 weeks

## 2010-08-09 NOTE — Progress Notes (Signed)
  Subjective:    Patient ID: Michelle Trevino, female    DOB: Oct 04, 1970, 40 y.o.   MRN: [id number]  HPICurrently not sexually active with sperm donor, in a same sex relationship.  Plans on prioritizing her health before trying to get pregnant again.  Migraine:  Went to ER for migraine.  Has daily migraine, Continues to take butalbital almost daily.  Was prescribed sumatriptan by ER but has not picked up.  Currently ok but taking pain medications daily.    Hypertension:  Did not bring back BP log.  Says she check it a few times at home with readings in the 130's-80's.  In the ER BP was 162/87.  Chest pain once since last seen- one episode at rest described as sharp, lasting a few seconds without dyspnea or nausea, or radiation.  Obesity:  Has gained 1 pound since last visit several weeks ago.  States she is exercising some and "watching" her diet.  Left leg tingling:  Happened when she was sitting, self resolved.  No pain, just described as numbness and tingling down back of leg.  No weakness.  Review of Systems Please see HPI      Objective:   Physical Exam  Constitutional: She appears well-developed and well-nourished.  Cardiovascular: Normal rate and regular rhythm.   No murmur heard. Pulmonary/Chest: Effort normal and breath sounds normal.  Musculoskeletal: Normal range of motion.       Trace LE edema symmetric          Assessment & Plan:

## 2010-08-09 NOTE — Assessment & Plan Note (Addendum)
Patient reluctant to schedule nutrition appointment as she feels she is has taken many classes on weight loss but "just needs to do it"  Advised that I will be in nutrition clinic and will work toward a plan for her to help make her successful.  Will place referral for nutrition clinic, given diet log to bring to appt.

## 2010-08-09 NOTE — Patient Instructions (Addendum)
We will contact you about nutrition clinic appointment Keep food diary and bring to this appointment Please check your blood pressures several times weekly and bring to next appointment Avoid using pain medications more than twice a week Schedule follow-up with me in 3-4 weeks

## 2010-08-09 NOTE — Assessment & Plan Note (Addendum)
Did not bring log today.  Will start BB for both migraine prophylaxis and BP control.  Discussed with patient i did not recommend pregnancy at this time, but will start medication that will be consistent with pregnancy

## 2010-08-17 ENCOUNTER — Ambulatory Visit: Payer: Medicaid Other | Admitting: Family Medicine

## 2010-08-17 ENCOUNTER — Other Ambulatory Visit: Payer: Self-pay | Admitting: Obstetrics and Gynecology

## 2010-08-17 ENCOUNTER — Encounter: Payer: Self-pay | Admitting: *Deleted

## 2010-08-18 LAB — DIFFERENTIAL
Basophils Absolute: 0 10*3/uL (ref 0.0–0.1)
Basophils Relative: 1 % (ref 0–1)
Eosinophils Absolute: 0.1 10*3/uL (ref 0.0–0.7)
Eosinophils Relative: 3 % (ref 0–5)
Lymphocytes Relative: 45 % (ref 12–46)
Lymphs Abs: 2.5 10*3/uL (ref 0.7–4.0)
Monocytes Absolute: 0.4 10*3/uL (ref 0.1–1.0)
Monocytes Relative: 7 % (ref 3–12)
Neutro Abs: 2.5 10*3/uL (ref 1.7–7.7)
Neutrophils Relative %: 45 % (ref 43–77)

## 2010-08-18 LAB — BASIC METABOLIC PANEL
BUN: 4 mg/dL — ABNORMAL LOW (ref 6–23)
CO2: 29 mEq/L (ref 19–32)
Calcium: 8.7 mg/dL (ref 8.4–10.5)
Chloride: 106 mEq/L (ref 96–112)
Creatinine, Ser: 0.73 mg/dL (ref 0.4–1.2)
GFR calc Af Amer: >60 mL/min (ref >60)
GFR calc non Af Amer: >60 mL/min (ref >60)
Glucose, Bld: 111 mg/dL — ABNORMAL HIGH (ref 70–99)
Potassium: 4.1 mEq/L (ref 3.5–5.1)
Sodium: 138 mEq/L (ref 135–145)

## 2010-08-18 LAB — URINALYSIS, ROUTINE W REFLEX MICROSCOPIC
Bilirubin Urine: NEGATIVE
Glucose, UA: NEGATIVE mg/dL
Hgb urine dipstick: NEGATIVE
Ketones, ur: 15 mg/dL — AB
Nitrite: NEGATIVE
Protein, ur: NEGATIVE mg/dL
Specific Gravity, Urine: 1.036 — ABNORMAL HIGH (ref 1.005–1.030)
Urobilinogen, UA: 1 mg/dL (ref 0.0–1.0)
pH: 6 (ref 5.0–8.0)

## 2010-08-18 LAB — CBC
HCT: 36.8 % (ref 36.0–46.0)
Hemoglobin: 12.2 g/dL (ref 12.0–15.0)
MCH: 30 pg (ref 26.0–34.0)
MCHC: 33.2 g/dL (ref 30.0–36.0)
MCV: 90.4 fL (ref 78.0–100.0)
Platelets: 294 10*3/uL (ref 150–400)
RBC: 4.07 MIL/uL (ref 3.87–5.11)
RDW: 12.4 % (ref 11.5–15.5)
WBC: 5.6 10*3/uL (ref 4.0–10.5)

## 2010-08-18 LAB — GC/CHLAMYDIA PROBE AMP, GENITAL: GC Probe Amp, Genital: NEGATIVE

## 2010-08-18 LAB — WET PREP, GENITAL
Trich, Wet Prep: NONE SEEN
WBC, Wet Prep HPF POC: NONE SEEN
Yeast Wet Prep HPF POC: NONE SEEN

## 2010-08-18 LAB — POCT PREGNANCY, URINE: Preg Test, Ur: NEGATIVE

## 2010-09-01 ENCOUNTER — Ambulatory Visit: Payer: Medicaid Other | Admitting: Family Medicine

## 2011-01-02 ENCOUNTER — Other Ambulatory Visit: Payer: Self-pay | Admitting: Family Medicine

## 2011-01-02 NOTE — Telephone Encounter (Signed)
Refill request

## 2011-02-21 ENCOUNTER — Ambulatory Visit: Payer: Medicaid Other | Admitting: Family Medicine

## 2011-03-10 ENCOUNTER — Encounter: Payer: Self-pay | Admitting: Family Medicine

## 2011-03-10 ENCOUNTER — Ambulatory Visit (INDEPENDENT_AMBULATORY_CARE_PROVIDER_SITE_OTHER): Payer: Medicaid Other | Admitting: Family Medicine

## 2011-03-10 VITALS — BP 138/89 | HR 75 | Temp 98.4°F | Ht 61.25 in | Wt 219.1 lb

## 2011-03-10 DIAGNOSIS — G43909 Migraine, unspecified, not intractable, without status migrainosus: Secondary | ICD-10-CM

## 2011-03-10 DIAGNOSIS — E669 Obesity, unspecified: Secondary | ICD-10-CM

## 2011-03-10 DIAGNOSIS — Z23 Encounter for immunization: Secondary | ICD-10-CM

## 2011-03-10 DIAGNOSIS — I10 Essential (primary) hypertension: Secondary | ICD-10-CM

## 2011-03-10 MED ORDER — TOPIRAMATE 25 MG PO TABS: ORAL_TABLET | ORAL | Status: DC

## 2011-03-10 MED ORDER — METOPROLOL TARTRATE 50 MG PO TABS: 50.0000 mg | ORAL_TABLET | Freq: Two times a day (BID) | ORAL | Status: DC

## 2011-03-10 NOTE — Assessment & Plan Note (Signed)
Will refill metoprolol.  Encouraged her success with lifestyle modification.

## 2011-03-10 NOTE — Patient Instructions (Signed)
Week 1: one tablet QHS Week 2: one tablet twice a day Week 3: 2 tabs at night, one in morning Week 4: 2 tabs twice a day  Follow-up in 1 month   Remember: Topmax is not a medicine you can take while pregnant- can cause birth defects!!!

## 2011-03-10 NOTE — Assessment & Plan Note (Signed)
Good progress with weight loss. Encouraged continued lifestyle modification.

## 2011-03-10 NOTE — Assessment & Plan Note (Signed)
Will use topamax for preventive tx and we discussed role of medication overuse.   Will titrate up over the first month and patient to return for follow-up.  We discussed teratogenicity extensively.  No chance of pregnancy as in a same sex relationship.  She will discontinue if she does choose to find a sperm donor.

## 2011-03-10 NOTE — Progress Notes (Signed)
  Subjective:    Patient ID: Michelle Trevino, female    DOB: 1970/08/03, 40 y.o.   MRN: [id number]  HPI  Here for follow-up, last seen march 2012:  HYPERTENSION  BP Readings from Last 3 Encounters:  03/10/11 138/89  08/09/10 128/84  07/25/10 145/92    Hypertension ROS: not taking medications regularly as instructed, no medication side effects noted, patient does not perform home BP monitoring, no TIA's, no chest pain on exertion, no dyspnea on exertion, no swelling of ankles and no intermittent claudication symptoms.   Migraines:  Metoprolol did help, but is now having more intense daily headaches, same charter as previous.  notes frequent tylenol and aleve use.  Obesity:  Has notes improvement in her weight as she has cut out sodas, makes her own soups for meals, and makes her own vegetable smoothies for breakfast  [facility]:  [facility].  Is in a same sex relationship.  Not currently trying to get pregnant with a sperm donor. Review of Systems See hpi    Objective:   Physical Exam GEN: Alert & Oriented, No acute distress CV:  Regular Rate & Rhythm, no murmur Respiratory:  Normal work of breathing, CTAB Abd:  + BS, soft, no tenderness to palpation Ext: no pre-tibial edema Neuro: CN 2- 12 grossly intact.         Assessment & Plan:

## 2011-04-14 ENCOUNTER — Ambulatory Visit: Payer: Medicaid Other | Admitting: Family Medicine

## 2011-04-18 ENCOUNTER — Encounter: Payer: Self-pay | Admitting: Family Medicine

## 2011-04-18 ENCOUNTER — Ambulatory Visit (INDEPENDENT_AMBULATORY_CARE_PROVIDER_SITE_OTHER): Payer: Medicaid Other | Admitting: Family Medicine

## 2011-04-18 VITALS — BP 102/66 | HR 80 | Temp 97.7°F | Ht 61.25 in | Wt 207.8 lb

## 2011-04-18 DIAGNOSIS — I10 Essential (primary) hypertension: Secondary | ICD-10-CM

## 2011-04-18 DIAGNOSIS — E669 Obesity, unspecified: Secondary | ICD-10-CM

## 2011-04-18 DIAGNOSIS — Z23 Encounter for immunization: Secondary | ICD-10-CM

## 2011-04-18 DIAGNOSIS — G43909 Migraine, unspecified, not intractable, without status migrainosus: Secondary | ICD-10-CM

## 2011-04-18 MED ORDER — SUMATRIPTAN SUCCINATE 100 MG PO TABS: 100.0000 mg | ORAL_TABLET | ORAL | Status: DC | PRN

## 2011-04-18 MED ORDER — MELOXICAM 15 MG PO TABS: 15.0000 mg | ORAL_TABLET | Freq: Every day | ORAL | Status: DC

## 2011-04-18 MED ORDER — ZOLPIDEM TARTRATE 5 MG PO TABS: 5.0000 mg | ORAL_TABLET | Freq: Every evening | ORAL | Status: DC | PRN

## 2011-04-18 NOTE — Progress Notes (Signed)
  Subjective:    Patient ID: Michelle Trevino, female    DOB: 02-18-1971, 40 y.o.   MRN: [id number]  HPI Here for follow-up of chronic migraine   At last visit started on topamax.  Patient also discontinued her metoprolol due to losing weight and noticing lower blood pressures.  Not sure if the metoprolol helped with migraine prophylaxis.  Stopped the topamax as she has mental slowness and felt "out of her body"  Did not help with migraines.  Continued to have daily migraines 3-4/10 and on some days 8/10. + photophobia, + phonophobia.  Taking daily OTC pain meds.      Obesity: Has made tremendous weight loss, feeling much better, motivated to continue.     Wt Readings from Last 3 Encounters:  04/18/11 207 lb 12.8 oz (94.257 kg)  03/10/11 219 lb 1.6 oz (99.383 kg)  08/09/10 228 lb 8 oz (103.647 kg)   HYPERTENSION  BP Readings from Last 3 Encounters:  04/18/11 102/66  03/10/11 138/89  08/09/10 128/84    Hypertension ROS: not taking medications regularly as instructed, no chest pain on exertion, no dyspnea on exertion and no intermittent claudication symptoms.   Much improved off all medications due to weight loss and salt restriction.   Review of Systemssee HPI     Objective:   Physical Exam GEN: Alert & Oriented, No acute distress CV:  Regular Rate & Rhythm, no murmur Respiratory:  Normal work of breathing, CTAB Abd:  + BS, soft, no tenderness to palpation Ext: no pre-tibial edema;       Assessment & Plan:

## 2011-04-18 NOTE — Patient Instructions (Addendum)
Sumatriptan when you have one of your bad headaches, may repeat in 2 hours if needed. Use meloxicam for pain  Try to wean down total pain medicines  [facility]- If you dont hear about an appointment this week let me know

## 2011-04-18 NOTE — Assessment & Plan Note (Signed)
Much improved.  Congratulated and encouraged continued success

## 2011-04-18 NOTE — Assessment & Plan Note (Signed)
Component of medication overuse as well as migraine.  Has failed both topamax and metoprolol as prophylaxis.  Will send to [facility] for further evaluation.  Will try sumatriptan and meloxcam for abortive tx

## 2011-04-18 NOTE — Assessment & Plan Note (Signed)
Doing well with wight loss and salt restriction.  Off all oral antihypertensives

## 2011-05-16 ENCOUNTER — Telehealth: Payer: Self-pay | Admitting: Family Medicine

## 2011-05-16 NOTE — Telephone Encounter (Signed)
Has not heard from Neuro referral - pls let her know status

## 2011-05-18 NOTE — Telephone Encounter (Signed)
LVM for patient to call back. Called [facility] to check on the status of the referral, I was informed then that they do not accept Medicaid. [facility] did not call back to inform of this and assumed referral had gone thorough and they notify patient of an appointment. There is not another headache center to send patient to. If patient knows of or can find another one she can let us know where she would like to go to.

## 2011-05-19 NOTE — Telephone Encounter (Signed)
LVM for patient to call abck

## 2011-05-25 NOTE — Telephone Encounter (Signed)
Michelle Trevino, Being that this patient cannot go to [facility] due to them not taking medicaid. She wasn't going to go to neurologist specifically correct.

## 2011-05-25 NOTE — Telephone Encounter (Signed)
Pt is asking about neurology appt - instead of [facility]

## 2011-05-26 NOTE — Telephone Encounter (Signed)
[facility] does take patient's with diagnosis of migraine headaches. Will call later and set this up.

## 2011-05-26 NOTE — Telephone Encounter (Signed)
May arrange neurology referral if they are agreeable to seeing migraine.

## 2011-06-12 NOTE — Telephone Encounter (Signed)
Faxed referral to [facility] with office notes to [phone].

## 2011-06-18 ENCOUNTER — Encounter (HOSPITAL_COMMUNITY): Payer: Self-pay | Admitting: *Deleted

## 2011-06-18 ENCOUNTER — Emergency Department (INDEPENDENT_AMBULATORY_CARE_PROVIDER_SITE_OTHER)
Admission: EM | Admit: 2011-06-18 | Discharge: 2011-06-18 | Disposition: A | Discharge status: Home / Self Care | Payer: Medicaid Other | Source: Home / Self Care | Attending: Family Medicine | Admitting: Family Medicine

## 2011-06-18 DIAGNOSIS — J029 Acute pharyngitis, unspecified: Secondary | ICD-10-CM

## 2011-06-18 DIAGNOSIS — J069 Acute upper respiratory infection, unspecified: Secondary | ICD-10-CM

## 2011-06-18 LAB — POCT RAPID STREP A: Streptococcus, Group A Screen (Direct): NEGATIVE

## 2011-06-18 MED ORDER — ONDANSETRON HCL 4 MG PO TABS: 4.0000 mg | ORAL_TABLET | Freq: Three times a day (TID) | ORAL | Status: AC | PRN

## 2011-06-18 MED ORDER — FLUTICASONE PROPIONATE 50 MCG/ACT NA SUSP: NASAL | Status: DC

## 2011-06-18 MED ORDER — PENICILLIN V POTASSIUM 500 MG PO TABS: 500.0000 mg | ORAL_TABLET | Freq: Three times a day (TID) | ORAL | Status: AC

## 2011-06-18 MED ORDER — PENICILLIN V POTASSIUM 500 MG PO TABS: 500.0000 mg | ORAL_TABLET | Freq: Three times a day (TID) | ORAL | Status: DC

## 2011-06-18 MED ORDER — ONDANSETRON HCL 4 MG PO TABS: 4.0000 mg | ORAL_TABLET | Freq: Three times a day (TID) | ORAL | Status: DC | PRN

## 2011-06-18 MED ORDER — PHENYLEPHRINE-GUAIFENESIN 30-400 MG PO CP12: ORAL_CAPSULE | ORAL | Status: DC

## 2011-06-18 NOTE — ED Notes (Signed)
Co sorethroat with ha, and congested cough x 3 days

## 2011-06-18 NOTE — ED Provider Notes (Signed)
History     CSN: [id number]  Arrival date & time 06/18/11  1554   First MD Initiated Contact with Patient 06/18/11 1646      Chief Complaint  Patient presents with  . Sore Throat    (Consider location/radiation/quality/duration/timing/severity/associated sxs/prior treatment) HPI Comments: 41 y/o female h/o migraines and wheezing here c/o sore throat, headache, subjective fever and body aches for 3 days. Also c/o cough with yellow sputum and pain with swallowing. No chest pain wheezing or shortness of breath. Drinking fluids well. Had had nausea but no vomiting or diarrhea.   History reviewed. No pertinent past medical history.  Past Surgical History  Procedure Date  . Tubal ligation 2000  . Reversal of tubal ligation 2011    Family History  Problem Relation Age of Onset  . Diabetes Mother   . Hypertension Mother   . Osteoarthritis Mother   . Stroke Mother 74  . Cancer Father 73    Stomach??  . Stroke Brother 65    History  Substance Use Topics  . Smoking status: Current Everyday Smoker -- 0.5 packs/day  . Smokeless tobacco: Never Used  . Alcohol Use: Not on file    OB History    Grav Para Term Preterm Abortions TAB SAB Ect Mult Living   1  0 0   0 0 0      Obstetric Comments   [id number] TAB's      Review of Systems  Constitutional: Positive for chills and appetite change.  HENT: Positive for congestion, sore throat, rhinorrhea and trouble swallowing. Negative for neck pain and voice change.   Respiratory: Positive for cough. Negative for shortness of breath and wheezing.   Gastrointestinal: Positive for nausea. Negative for vomiting, abdominal pain and diarrhea.  Neurological: Positive for headaches.    Allergies  Review of patient's allergies indicates no known allergies.  Home Medications   Current Outpatient Rx  Name Route Sig Dispense Refill  . ALBUTEROL SULFATE HFA 108 (90 BASE) MCG/ACT IN AERS Inhalation Inhale 2 puffs into the lungs every 4  (four) hours as needed. For shortness of breath or wheezing     . FLUTICASONE PROPIONATE 50 MCG/ACT NA SUSP  2 spray in each nostril daily 16 g 1  . MELOXICAM 15 MG PO TABS Oral Take 1 tablet (15 mg total) by mouth daily. 30 tablet 2  . ONDANSETRON HCL 4 MG PO TABS Oral Take 1 tablet (4 mg total) by mouth every 8 (eight) hours as needed for nausea. 6 tablet 0  . PENICILLIN V POTASSIUM 500 MG PO TABS Oral Take 1 tablet (500 mg total) by mouth 3 (three) times daily. 30 tablet 0  . PHENYLEPHRINE-GUAIFENESIN 30-400 MG PO CP12  Take 1 tab po bid prn for cough and congestion 14 each 0  . CALNA PO TABS Oral Take 1 tablet by mouth daily.      . SUMATRIPTAN SUCCINATE 100 MG PO TABS Oral Take 1 tablet (100 mg total) by mouth every 2 (two) hours as needed for migraine. 10 tablet 1  . ZOLPIDEM TARTRATE 5 MG PO TABS Oral Take 1 tablet (5 mg total) by mouth at bedtime as needed for sleep. Take 1 tab 10-20 minutes prior to bedtime. 30 tablet 0    BP 153/75  Pulse 89  Temp(Src) 99.5 F (37.5 C) (Oral)  Resp 18  SpO2 98%  LMP 06/14/2011  Physical Exam  Nursing note and vitals reviewed. Constitutional: She is oriented to person, place, and  time. She appears well-developed and well-nourished. No distress.  HENT:       Nasal Congestion with erythema and swelling of nasal turbinates, clear rhinorrhea. Significant pharyngeal erythema white adherent exudate in right tonsil. No uvula deviation. No trismus. TM's with increased vascular markings and some dullness bilaterally no swelling or bulging   Eyes: Conjunctivae and EOM are normal. Pupils are equal, round, and reactive to light.  Neck: Neck supple.       Right tender enlarged anterior neck lymph node.  Cardiovascular: Normal rate, regular rhythm and normal heart sounds.   Pulmonary/Chest: Effort normal and breath sounds normal. No respiratory distress. She has no wheezes. She has no rales. She exhibits no tenderness.  Abdominal: Soft. There is no  tenderness.  Lymphadenopathy:    She has cervical adenopathy.  Neurological: She is alert and oriented to person, place, and time.  Skin: No rash noted.    ED Course  Procedures (including critical care time)   Labs Reviewed  POCT RAPID STREP A ([facility] CARE ONLY)  LAB REPORT - SCANNED  STREP A DNA PROBE  STREP A DNA PROBE   No results found.   1. URI (upper respiratory infection)   2. Pharyngitis       MDM  Negative rapid strep likely viral process but possible bacterial pharyngitis. Throat culture pending. Decided to treat with penicillin.         Sharin Grave, MD 06/20/11 [phone]

## 2011-06-19 NOTE — ED Notes (Signed)
Lab called stating that the DNA probe they received had Michelle Trevino's stickers on it and order was for [name].  Stated they would have to cancel order due to this.

## 2011-07-24 ENCOUNTER — Other Ambulatory Visit: Payer: Self-pay | Admitting: Family Medicine

## 2011-07-24 NOTE — Telephone Encounter (Signed)
Refill request

## 2011-08-24 ENCOUNTER — Other Ambulatory Visit: Payer: Self-pay | Admitting: Family Medicine

## 2011-09-14 ENCOUNTER — Other Ambulatory Visit (HOSPITAL_COMMUNITY)
Admission: RE | Admit: 2011-09-14 | Discharge: 2011-09-14 | Disposition: A | Discharge status: Home / Self Care | Payer: Medicaid Other | Source: Ambulatory Visit | Attending: Family Medicine | Admitting: Family Medicine

## 2011-09-14 ENCOUNTER — Ambulatory Visit (INDEPENDENT_AMBULATORY_CARE_PROVIDER_SITE_OTHER): Payer: Medicaid Other | Admitting: Family Medicine

## 2011-09-14 ENCOUNTER — Encounter: Payer: Self-pay | Admitting: Family Medicine

## 2011-09-14 VITALS — BP 116/79 | HR 83 | Temp 98.7°F | Ht 61.25 in | Wt 207.0 lb

## 2011-09-14 DIAGNOSIS — E669 Obesity, unspecified: Secondary | ICD-10-CM

## 2011-09-14 DIAGNOSIS — N760 Acute vaginitis: Secondary | ICD-10-CM

## 2011-09-14 DIAGNOSIS — R350 Frequency of micturition: Secondary | ICD-10-CM

## 2011-09-14 DIAGNOSIS — R21 Rash and other nonspecific skin eruption: Secondary | ICD-10-CM | POA: Insufficient documentation

## 2011-09-14 DIAGNOSIS — Z113 Encounter for screening for infections with a predominantly sexual mode of transmission: Secondary | ICD-10-CM | POA: Insufficient documentation

## 2011-09-14 DIAGNOSIS — R32 Unspecified urinary incontinence: Secondary | ICD-10-CM | POA: Insufficient documentation

## 2011-09-14 LAB — POCT WET PREP (WET MOUNT)

## 2011-09-14 LAB — POCT URINALYSIS DIPSTICK
Ketones, UA: NEGATIVE
Leukocytes, UA: NEGATIVE
Spec Grav, UA: 1.02
Urobilinogen, UA: 1
pH, UA: 7

## 2011-09-14 LAB — POCT UA - MICROSCOPIC ONLY

## 2011-09-14 MED ORDER — METRONIDAZOLE 0.75 % VA GEL: 1.0000 | Freq: Every evening | VAGINAL | Status: AC | PRN

## 2011-09-14 MED ORDER — TRIAMCINOLONE ACETONIDE 0.1 % EX CREA: TOPICAL_CREAM | Freq: Two times a day (BID) | CUTANEOUS | Status: DC

## 2011-09-14 NOTE — Progress Notes (Signed)
  Subjective:    Patient ID: Michelle Trevino, female    DOB: 10/06/1970, 41 y.o.   MRN: [id number]  HPIfor 1 month has noticed panty liner is wet.  Described as totally wet- does not feel it is vaginal discharge. No urinary freuquency, notes drinking less than usual- not thirsty.  Does not notice leakage when she coughs or at other times.  Just notices she is wet when she goes to the bathroom.  Does not soak through clothing.  Rash:  Itchy papules for several weeks- after moving into new home Review of Systems + contipation.  No fever, diarrhea, vaginal discharge, abdominal pain. Some chronic LE edema.    I have reviewed patient's  PMH, FH, and Social history and Medications as related to this visit.  Objective:   Physical Exam GEN: Alert & Oriented, No acute distress CV:  Regular Rate & Rhythm, no murmur Respiratory:  Normal work of breathing, CTAB Abd:  + BS, soft, no tenderness to palpation Ext: trace pretibial edema. Pelvic Exam:        External: normal female genitalia without lesions or masses        Vagina: normal without lesions or masses- no cystocele         Cervix: normal without lesions or masses        Adnexa: normal bimanual exam without masses or fullness        Uterus: normal by palpation        Samples for Wet prep, GC/Chlamydia obtained  Skin:  Several itchy erythematous papules on lower legs       Assessment & Plan:

## 2011-09-14 NOTE — Assessment & Plan Note (Signed)
Appears to be insect bites after moving into new home.  Advised triamcinolone cream- thinking about pets or new exposures as possible recurrent source.

## 2011-09-14 NOTE — Assessment & Plan Note (Signed)
Unclear if this leakage is truly incontinence.  No obvious cystocele or rectocele in exam.  Will treat BV today and asked patient to keep log of symptoms, fluid intake to characterize this better.  Will have her return for fasting blood work to rule out hyperglycemia and will return to review log.

## 2011-09-14 NOTE — Patient Instructions (Signed)
Keep log of how much you drink, when and how much you urinate- leakage noted  Come back for fasting bloodwork 2 days before your physical.

## 2011-09-17 ENCOUNTER — Other Ambulatory Visit: Payer: Self-pay | Admitting: Family Medicine

## 2011-09-18 ENCOUNTER — Encounter: Payer: Self-pay | Admitting: Family Medicine

## 2011-09-20 ENCOUNTER — Other Ambulatory Visit: Payer: Medicaid Other

## 2011-09-20 DIAGNOSIS — R350 Frequency of micturition: Secondary | ICD-10-CM

## 2011-09-20 DIAGNOSIS — E669 Obesity, unspecified: Secondary | ICD-10-CM

## 2011-09-20 LAB — CBC
HCT: 40.4 % (ref 36.0–46.0)
MCH: 29.2 pg (ref 26.0–34.0)
MCV: 93.5 fL (ref 78.0–100.0)
RBC: 4.32 MIL/uL (ref 3.87–5.11)
WBC: 4.7 10*3/uL (ref 4.0–10.5)

## 2011-09-20 NOTE — Progress Notes (Signed)
Cmp,cbc,flp and tsh done today Thaddeaus Monica 

## 2011-09-21 LAB — COMPREHENSIVE METABOLIC PANEL
Alkaline Phosphatase: 65 U/L (ref 39–117)
CO2: 28 mEq/L (ref 19–32)
Creat: 0.74 mg/dL (ref 0.50–1.10)
Glucose, Bld: 94 mg/dL (ref 70–99)
Total Bilirubin: 0.3 mg/dL (ref 0.3–1.2)

## 2011-09-21 LAB — LIPID PANEL
Cholesterol: 169 mg/dL (ref 0–200)
HDL: 53 mg/dL (ref >39)
LDL Cholesterol: 103 mg/dL — ABNORMAL HIGH (ref 0–99)
Total CHOL/HDL Ratio: 3.2 Ratio
Triglycerides: 67 mg/dL (ref <150)
VLDL: 13 mg/dL (ref 0–40)

## 2011-09-21 LAB — TSH: TSH: 1.798 u[IU]/mL (ref 0.350–4.500)

## 2011-09-22 ENCOUNTER — Telehealth: Payer: Self-pay | Admitting: Family Medicine

## 2011-09-22 NOTE — Telephone Encounter (Signed)
Please tell her letter was sent on 16th- update address if needed.  Read her letter.  We can discuss in further detail at her upcoming appointment

## 2011-09-22 NOTE — Telephone Encounter (Signed)
Called and informed pt of lab results.Laureen Ochs, Viann Shove

## 2011-09-22 NOTE — Telephone Encounter (Signed)
Pt is calling to ask about lab results

## 2011-09-25 ENCOUNTER — Encounter: Payer: Medicaid Other | Admitting: Family Medicine

## 2011-09-28 ENCOUNTER — Encounter: Payer: Medicaid Other | Admitting: Family Medicine

## 2011-11-03 ENCOUNTER — Encounter: Payer: Self-pay | Admitting: Family Medicine

## 2011-11-03 ENCOUNTER — Ambulatory Visit (INDEPENDENT_AMBULATORY_CARE_PROVIDER_SITE_OTHER): Payer: Medicaid Other | Admitting: Family Medicine

## 2011-11-03 VITALS — BP 136/90 | HR 85 | Ht 61.0 in

## 2011-11-03 DIAGNOSIS — Z111 Encounter for screening for respiratory tuberculosis: Secondary | ICD-10-CM

## 2011-11-03 DIAGNOSIS — R21 Rash and other nonspecific skin eruption: Secondary | ICD-10-CM

## 2011-11-03 DIAGNOSIS — I10 Essential (primary) hypertension: Secondary | ICD-10-CM

## 2011-11-03 DIAGNOSIS — R234 Changes in skin texture: Secondary | ICD-10-CM

## 2011-11-03 MED ORDER — HYDROCHLOROTHIAZIDE 25 MG PO TABS: 25.0000 mg | ORAL_TABLET | Freq: Every day | ORAL | Status: DC

## 2011-11-03 MED ORDER — CETIRIZINE HCL 10 MG PO TABS: 10.0000 mg | ORAL_TABLET | Freq: Every day | ORAL | Status: DC

## 2011-11-03 MED ORDER — PERMETHRIN 5 % EX CREA: TOPICAL_CREAM | Freq: Once | CUTANEOUS | Status: AC

## 2011-11-03 NOTE — Patient Instructions (Signed)
New medicine HCTZ for blood pressure and swelling  Will order mammogram for you  Follow-up in 3 months for bp recheck

## 2011-11-03 NOTE — Assessment & Plan Note (Addendum)
Exam not concerning for new change.  Was benign last year, patient concerned dimpling of nipples is worse.  She would like to repeat mammogram.  Will order diagnostic mammogram and right breast US for focal tenderness

## 2011-11-03 NOTE — Assessment & Plan Note (Signed)
Will tx with permethrin.  Pattern not suggestive of scabies.  Advised most importantly not to pick skin which may be perpetuating recurring excoriated papules.  Continue benadryl at night prn.

## 2011-11-03 NOTE — Assessment & Plan Note (Signed)
Will start HCTZ, will hopefully help with her edema which is not percept able today,.

## 2011-11-03 NOTE — Progress Notes (Signed)
  Subjective:    Patient ID: Michelle Trevino, female    DOB: August 18, 1970, 41 y.o.   MRN: [id number]  HPI   breast pain:  Bilateral breast pain, right greater than left.  Located beneath nipples.  Feels like her breast are becoming more dishapen.  Notices dimpling in bilateral nipples when she raises her arm.  Has Right [place] and bilateral mammogram last years suggesting fibrocystic changes.  Patient feels breasts continue to be change since that time.  No niplpe discharge.  Rash:  Continues rash on right arm and on low back.  Daughter also has these.  Very itchy, not resolving after using triamcinolone cream.   Hypertension:  Continues to be ev elated.  NO chest pain.  Feels her feet are swollen.  Even when she first wakes up in the morning.  Review of Systems See HPI    Objective:   Physical Exam GEN: Alert & Oriented, No acute distress Breast:  No nodules or lymphadenopathy.  No nipple discharge.  Dimpling symmetrically neat both nipple upon raising arms. CV:  Regular Rate & Rhythm, no murmur Respiratory:  Normal work of breathing, CTAB Abd:  + BS, soft, no tenderness to palpation Ext: no pre-tibial edema Skin:  Small excoriated papules scattered across right arm. One on low back.        Assessment & Plan:

## 2011-11-03 NOTE — Assessment & Plan Note (Deleted)
Managed by [facility] neurology

## 2011-11-06 ENCOUNTER — Ambulatory Visit (INDEPENDENT_AMBULATORY_CARE_PROVIDER_SITE_OTHER): Payer: Medicaid Other | Admitting: *Deleted

## 2011-11-06 ENCOUNTER — Encounter: Payer: Self-pay | Admitting: *Deleted

## 2011-11-06 DIAGNOSIS — IMO0001 Reserved for inherently not codable concepts without codable children: Secondary | ICD-10-CM

## 2011-11-06 DIAGNOSIS — Z111 Encounter for screening for respiratory tuberculosis: Secondary | ICD-10-CM

## 2011-11-06 LAB — TB SKIN TEST: TB Skin Test: NEGATIVE

## 2011-11-14 ENCOUNTER — Telehealth: Payer: Self-pay | Admitting: *Deleted

## 2011-11-14 NOTE — Telephone Encounter (Signed)
Received call from [facility] Lowanda Foster).  Patient referred to their office for migraine headaches.  Due to patient having Medicaid, office calling to request NPI #  to authorize appt.  NPI # given.  Gaylene Brooks, RN

## 2011-11-22 ENCOUNTER — Other Ambulatory Visit: Payer: Medicaid Other

## 2011-11-23 ENCOUNTER — Other Ambulatory Visit: Payer: Self-pay | Admitting: Family Medicine

## 2011-12-14 ENCOUNTER — Ambulatory Visit
Admission: RE | Admit: 2011-12-14 | Discharge: 2011-12-14 | Disposition: A | Discharge status: Home / Self Care | Payer: Medicaid Other | Source: Ambulatory Visit | Attending: Family Medicine | Admitting: Family Medicine

## 2011-12-14 DIAGNOSIS — R234 Changes in skin texture: Secondary | ICD-10-CM

## 2012-03-05 ENCOUNTER — Ambulatory Visit: Payer: Medicaid Other | Admitting: Family Medicine

## 2012-03-07 ENCOUNTER — Encounter: Payer: Self-pay | Admitting: Family Medicine

## 2012-03-07 ENCOUNTER — Ambulatory Visit (INDEPENDENT_AMBULATORY_CARE_PROVIDER_SITE_OTHER): Payer: Medicaid Other | Admitting: Family Medicine

## 2012-03-07 VITALS — BP 135/85 | HR 109 | Temp 99.7°F | Ht 61.25 in | Wt 226.3 lb

## 2012-03-07 DIAGNOSIS — K625 Hemorrhage of anus and rectum: Secondary | ICD-10-CM

## 2012-03-07 DIAGNOSIS — Z23 Encounter for immunization: Secondary | ICD-10-CM

## 2012-03-07 DIAGNOSIS — E669 Obesity, unspecified: Secondary | ICD-10-CM

## 2012-03-07 DIAGNOSIS — R200 Anesthesia of skin: Secondary | ICD-10-CM | POA: Insufficient documentation

## 2012-03-07 DIAGNOSIS — R202 Paresthesia of skin: Secondary | ICD-10-CM

## 2012-03-07 DIAGNOSIS — R209 Unspecified disturbances of skin sensation: Secondary | ICD-10-CM

## 2012-03-07 NOTE — Patient Instructions (Addendum)
Rectal Bleeding Rectal bleeding is when blood passes out of the anus. It is usually a sign that something is wrong. It may not be serious, but it should always be evaluated. Rectal bleeding may present as bright red blood or extremely dark stools. The color may range from dark red or maroon to black (like tar). It is important that the cause of rectal bleeding be identified so treatment can be started and the problem corrected. CAUSES   Hemorrhoids. These are enlarged (dilated) blood vessels or veins in the anal or rectal area.  Fistulas. Theseare abnormal, burrowing channels that usually run from inside the rectum to the skin around the anus. They can bleed.  Anal fissures. This is a tear in the tissue of the anus. Bleeding occurs with bowel movements.  Diverticulosis. This is a condition in which pockets or sacs project from the bowel wall. Occasionally, the sacs can bleed.  Diverticulitis. Thisis an infection involving diverticulosis of the colon.  Proctitis and colitis. These are conditions in which the rectum, colon, or both, can become inflamed and pitted (ulcerated).  Polyps and cancer. Polyps are non-cancerous (benign) growths in the colon that may bleed. Certain types of polyps turn into cancer.  Protrusion of the rectum. Part of the rectum can project from the anus and bleed.  Certain medicines.  Intestinal infections.  Blood vessel abnormalities. HOME CARE INSTRUCTIONS  Eat a high-fiber diet to keep your stool soft.  Limit activity.  Drink enough fluids to keep your urine clear or pale yellow.  Warm baths may be useful to soothe rectal pain.  Follow up with your caregiver as directed. SEEK IMMEDIATE MEDICAL CARE IF:  You develop increased bleeding.  You have black or dark red stools.  You vomit blood or material that looks like coffee grounds.  You have abdominal pain or tenderness.  You have a fever.  You feel weak, nauseous, or you faint.  You have  severe rectal pain or you are unable to have a bowel movement. MAKE SURE YOU:  Understand these instructions.  Will watch your condition.  Will get help right away if you are not doing well or get worse. Document Released: 11/11/2001 Document Revised: 08/14/2011 Document Reviewed: 11/06/2010 [employer] Patient Information 2013 [employer].  

## 2012-03-07 NOTE — Assessment & Plan Note (Signed)
-   No obvious etiology of bleeding observable externally or internally with proctoscope.  - No blood noted on exam  - Exam completed with Dr. [place] present - GI referral recommended - Patient advised to return to clinic or ED if rectal bleeding increases, she becomes faint or develops a fever.  - discussed in length possible etiologies of rectal bleeding, including cancers. - AVS printed and given to patient on rectal bleeding. - F/U: with PCP as needed

## 2012-03-07 NOTE — Progress Notes (Signed)
Subjective:     Patient ID: Michelle Trevino, female   DOB: March 15, 1971, 41 y.o.   MRN: [id number]  HPI  Rectal bleeding: Patient presents with a 5 day history of rectal bleeding. She reports Saturday her partner had stuck her finger in the patients rectum and since then she has had rectal bleeding. She has noticed brown tinged blood on her toilet paper after every BM. She has two BMs a day. She also has noticed bright red drops in her toilet. She is in no pain currently or with BMs. She has never experienced rectal bleeding prior. She does have a history of hemorrhoids that she has never had problems with and believes has resolved. She denies fever, chills, changes in bowel habits or size/shape. She did not notice if her stool had blood in it. She has never had a colonoscopy and has no personal or family history of colon or rectal cancers. She denies history of IBS, IBD, diverticulosis, constipation or diarrhea.  Numbness left foot: Patient states she had numbness for months to a year on the top of her left foot. She is excessively thirsty, "but it feels like sand" when she drinks because she is so thirsty. She also has noticed frequency in urination without pain or burning. She wonders if she has diabetes, because last check she was "borderline" and she had gestational diabetes.   Review of Systems    See HPI above. Objective:   Physical Exam  Constitutional: She appears well-developed and well-nourished. No distress.  Cardiovascular: Regular rhythm.  Tachycardia present.   No murmur heard.      Patient is nervous.   Pulmonary/Chest: Effort normal and breath sounds normal. No respiratory distress. She has no wheezes. She has no rales.  Abdominal: Soft. Bowel sounds are normal. She exhibits no distension and no mass. There is no tenderness. There is no rebound and no guarding.  Genitourinary: Rectum normal. Rectal exam shows no external hemorrhoid, no internal hemorrhoid, no fissure, no mass and no  tenderness.       No external hemorrhoids noted. Exam completed  with proctoscope.     Neurological: A sensory deficit is present.       Foot Exam: Sensory test of bilateral feet with monofilament test resulted with decreased sensation to numbness on her LEFT 2,4,5 tarsals and bottom of foot from the metatarsals to tarsals  Skin: Skin is warm and dry.  Psychiatric: She has a normal mood and affect.

## 2012-03-07 NOTE — Assessment & Plan Note (Signed)
-   patient has sensory changes of her left foot by microfilament test (please see note) - Polydipsia, polyuria per patient report - HgA1C completed today. - Management dependent on results; will call patient with results. - F/U: with PCP for plan of care pending lab result.

## 2012-03-08 ENCOUNTER — Telehealth: Payer: Self-pay | Admitting: Family Medicine

## 2012-03-08 NOTE — Telephone Encounter (Signed)
A1c is 5.5, anything else you want me to tell her? Fleeger, [name]

## 2012-03-08 NOTE — Telephone Encounter (Signed)
Pt informed and has appt on Tuesday. Michelle Trevino, [name]

## 2012-03-08 NOTE — Telephone Encounter (Signed)
Patient is calling for lab results.

## 2012-03-08 NOTE — Telephone Encounter (Signed)
Yes, just tell  Her to follow up with her PCP about the numbness in her foot. Her test result was normal, so they will have to look for other causes of her numbness.

## 2012-03-12 ENCOUNTER — Ambulatory Visit (INDEPENDENT_AMBULATORY_CARE_PROVIDER_SITE_OTHER): Payer: Medicaid Other | Admitting: Family Medicine

## 2012-03-12 ENCOUNTER — Encounter: Payer: Self-pay | Admitting: Gastroenterology

## 2012-03-12 ENCOUNTER — Encounter: Payer: Self-pay | Admitting: Family Medicine

## 2012-03-12 VITALS — BP 131/79 | HR 90 | Temp 99.7°F | Ht 61.25 in | Wt 225.0 lb

## 2012-03-12 DIAGNOSIS — R209 Unspecified disturbances of skin sensation: Secondary | ICD-10-CM

## 2012-03-12 DIAGNOSIS — R200 Anesthesia of skin: Secondary | ICD-10-CM

## 2012-03-12 LAB — CBC
Hemoglobin: 12.9 g/dL (ref 12.0–15.0)
RBC: 4.34 MIL/uL (ref 3.87–5.11)
WBC: 5.7 10*3/uL (ref 4.0–10.5)

## 2012-03-12 LAB — TSH: TSH: 1.607 u[IU]/mL (ref 0.350–4.500)

## 2012-03-12 LAB — VITAMIN B12: Vitamin B-12: 387 pg/mL (ref 211–911)

## 2012-03-12 NOTE — Patient Instructions (Addendum)
Use ibuprofen 600 mg three time a day as needed, ice massage to help with pain  Use slip on ankle brace as needed for support  Will send referral for podiatrist

## 2012-03-12 NOTE — Progress Notes (Signed)
  Subjective:    Patient ID: Michelle Trevino, female    DOB: [birth year], 41 y.o.   MRN: [id number]  HPI Follow-up of office visit by another provider 4 days ago for feet tingling  2-3 weeks of bilateral foot tingling and numbness.  Left worse than right.  Soles of both feet and around ankles.  "falls asleep" when walking or sitting.  Feels tender and swollen at all times.  Numbness and tinlgin lasts for 10-15 minutes at a time.  No history of injury surgery in feet.  Did sprain right ankle before remotely.  Asks to return to podiatrist for care of heel spurs.  Review of Systems See HPI    Objective:   Physical Exam GEN: Alert & Oriented, No acute distress CV:  Regular Rate & Rhythm, no murmur Respiratory:  Normal work of breathing, CTAB Abd:  + BS, soft, no tenderness to palpation Ext: no pre-tibial edema MSK;  Monofil;ament testing shows stocking numbness to ankle of left foot.  Right foot with decreased sensation on sole and dorsum of forefoot.       Assessment & Plan:

## 2012-03-12 NOTE — Assessment & Plan Note (Signed)
No appreciable swelling or tenderness.  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  a1c normal, will check TSH and B12.  Encouraged patient to use NSAIDS, ankle brace for symptomatic relief.  Discussed NCS as possibility for further diagnosis, but will wait for now.  Ok to return to podiatry for continue care of other foot problems.

## 2012-03-18 ENCOUNTER — Encounter: Payer: Self-pay | Admitting: Family Medicine

## 2012-04-02 ENCOUNTER — Ambulatory Visit (INDEPENDENT_AMBULATORY_CARE_PROVIDER_SITE_OTHER): Payer: Medicaid Other | Admitting: Gastroenterology

## 2012-04-02 ENCOUNTER — Encounter: Payer: Self-pay | Admitting: Gastroenterology

## 2012-04-02 VITALS — BP 110/70 | HR 80 | Ht 62.25 in | Wt 235.8 lb

## 2012-04-02 DIAGNOSIS — K625 Hemorrhage of anus and rectum: Secondary | ICD-10-CM

## 2012-04-02 DIAGNOSIS — K5900 Constipation, unspecified: Secondary | ICD-10-CM

## 2012-04-02 DIAGNOSIS — K219 Gastro-esophageal reflux disease without esophagitis: Secondary | ICD-10-CM

## 2012-04-02 MED ORDER — OMEPRAZOLE 20 MG PO CPDR: 20.0000 mg | DELAYED_RELEASE_CAPSULE | Freq: Every day | ORAL | Status: DC

## 2012-04-02 MED ORDER — LINACLOTIDE 145 MCG PO CAPS: 145.0000 ug | ORAL_CAPSULE | Freq: Every day | ORAL | Status: DC

## 2012-04-02 MED ORDER — PEG-KCL-NACL-NASULF-NA ASC-C 100 G PO SOLR: 1.0000 | Freq: Once | ORAL | Status: DC

## 2012-04-02 NOTE — Progress Notes (Signed)
History of Present Illness:  This is a very pleasant 41 year old African American female referred from [facility] for evaluation of an episode of rectal bleeding several weeks ago with associated left lower quadrant pain. This patient has chronic functional constipation and laxative dependency with associated gas and bloating. Several weeks ago she had left lower quadrant pain followed by bright red blood per rectum , was seen by family practice and had a negative proctoscopy exam. Review of her labs shows normal CBC a metabolic profile. She's had no further rectal bleeding, and has reverted back to her stated chronic constipation. This patient not had previous colonoscopy or barium studies. She also describes chronic reflux with burning substernal chest pain and regurgitation but no dysphagia. She is using Prilosec on a when necessary basis. Associated medical problems include asthma, sleep disturbance, and chronic anxiety syndrome. She has a past history of cocaine dependency. Family history is noncontributory except for mother who has been treated for hepatitis C. The patient denies previous episodes of hepatitis, pancreatitis, jaundice, or any known liver disease. She thinks she may be lactose intolerant.  I have reviewed this patient's present history, medical and surgical past history, allergies and medications.     ROS: The remainder of the 10 point ROS is negative.. she complains of chronic anxiety, nonproductive cough, fatigue, recurrent headaches, rather severe menstrual cramps, periodic peripheral edema, excessive thirst and urination, and occasional urinary incontinency. She also has mild essential hypertension and is on diuretics.     Physical Exam: Healthy-appearing patient in no acute distress. Blood pressure 110/70, pulse 80 and regular, and weight 235 pounds with BMI of 42.78. General well developed well nourished patient in no acute distress, appearing their stated age Eyes  PERRLA, no icterus, fundoscopic exam per opthamologist Skin no lesions noted Neck supple, no adenopathy, no thyroid enlargement, no tenderness Chest clear to percussion and auscultation Heart no significant murmurs, gallops or rubs noted Abdomen no hepatosplenomegaly masses or tenderness, BS normal.  Extremities no acute joint lesions, edema, phlebitis or evidence of cellulitis. Psychological mental status normal and normal affect.  Assessment and plan: Possible episode of ischemic colitis with left lower quadrant pain and rectal bleeding, no evidence of recurrence at this point. The patient has chronic functional slow transit constipation with associated gas and bloating, and may have lactose intolerance. In any case, she certainly needs colonoscopy exam to exclude colonic polyposis or underlying inflammatory bowel disease. This is been scheduled her convenience. She also has chronic GERD, and relates rather severe nocturnal acid reflux. I placed her on Prilosec 20 mg a day with standard antireflux maneuvers, and we will also perform endoscopy at the time of her colonoscopy exam with propofol sedation and nurse anesthesia attendance. We will try Linzess 145 mcg a day for her constipation, and I advised I high-fiber diet with liberal by mouth fluids. Review of her labs shows normal liver function tests, and I cannot appreciate hepatomegaly or stigmata of chronic liver disease.  Encounter Diagnoses  Name Primary?  . Rectal bleeding Yes  . Unspecified constipation

## 2012-04-02 NOTE — Patient Instructions (Addendum)
You have been scheduled for an endoscopy and colonoscopy with propofol. Please follow the written instructions given to you at your visit today. Please pick up your prep at the pharmacy within the next 1-3 days. If you use inhalers (even only as needed) or a CPAP machine, please bring them with you on the day of your procedure.  We have sent the following medications to your pharmacy for you to pick up at your convenience: Prilosec and Linzess to take one tablet by mouth once daily. Samples given of Linzess to start.   Gastroesophageal Reflux Disease, Adult Gastroesophageal reflux disease (GERD) happens when acid from your stomach flows up into the esophagus. When acid comes in contact with the esophagus, the acid causes soreness (inflammation) in the esophagus. Over time, GERD may create small holes (ulcers) in the lining of the esophagus. CAUSES   Increased body weight. This puts pressure on the stomach, making acid rise from the stomach into the esophagus.  Smoking. This increases acid production in the stomach.  Drinking alcohol. This causes decreased pressure in the lower esophageal sphincter (valve or ring of muscle between the esophagus and stomach), allowing acid from the stomach into the esophagus.  Late evening meals and a full stomach. This increases pressure and acid production in the stomach.  A malformed lower esophageal sphincter. Sometimes, no cause is found. SYMPTOMS   Burning pain in the lower part of the mid-chest behind the breastbone and in the mid-stomach area. This may occur twice a week or more often.  Trouble swallowing.  Sore throat.  Dry cough.  Asthma-like symptoms including chest tightness, shortness of breath, or wheezing. DIAGNOSIS  Your caregiver may be able to diagnose GERD based on your symptoms. In some cases, X-rays and other tests may be done to check for complications or to check the condition of your stomach and esophagus. TREATMENT  Your  caregiver may recommend over-the-counter or prescription medicines to help decrease acid production. Ask your caregiver before starting or adding any new medicines.  HOME CARE INSTRUCTIONS   Change the factors that you can control. Ask your caregiver for guidance concerning weight loss, quitting smoking, and alcohol consumption.  Avoid foods and drinks that make your symptoms worse, such as:  Caffeine or alcoholic drinks.  Chocolate.  Peppermint or mint flavorings.  Garlic and onions.  Spicy foods.  Citrus fruits, such as oranges, lemons, or limes.  Tomato-based foods such as sauce, chili, salsa, and pizza.  Fried and fatty foods.  Avoid lying down for the 3 hours prior to your bedtime or prior to taking a nap.  Eat small, frequent meals instead of large meals.  Wear loose-fitting clothing. Do not wear anything tight around your waist that causes pressure on your stomach.  Raise the head of your bed 6 to 8 inches with wood blocks to help you sleep. Extra pillows will not help.  Only take over-the-counter or prescription medicines for pain, discomfort, or fever as directed by your caregiver.  Do not take aspirin, ibuprofen, or other nonsteroidal anti-inflammatory drugs (NSAIDs). SEEK IMMEDIATE MEDICAL CARE IF:   You have pain in your arms, neck, jaw, teeth, or back.  Your pain increases or changes in intensity or duration.  You develop nausea, vomiting, or sweating (diaphoresis).  You develop shortness of breath, or you faint.  Your vomit is green, yellow, black, or looks like coffee grounds or blood.  Your stool is red, bloody, or black. These symptoms could be signs of other problems, such  as heart disease, gastric bleeding, or esophageal bleeding. MAKE SURE YOU:   Understand these instructions.  Will watch your condition.  Will get help right away if you are not doing well or get worse. Document Released: 03/01/2005 Document Revised: 08/14/2011 Document  Reviewed: 12/09/2010 [facility] Patient Information 2013 [place], [place].    Constipation, Adult Constipation is when a person has fewer than 3 bowel movements a week; has difficulty having a bowel movement; or has stools that are dry, hard, or larger than normal. As people grow older, constipation is more common. If you try to fix constipation with medicines that make you have a bowel movement (laxatives), the problem may get worse. Long-term laxative use may cause the muscles of the colon to become weak. A low-fiber diet, not taking in enough fluids, and taking certain medicines may make constipation worse. CAUSES   Certain medicines, such as antidepressants, pain medicine, iron supplements, antacids, and water pills.   Certain diseases, such as diabetes, irritable bowel syndrome (IBS), thyroid disease, or depression.   Not drinking enough water.   Not eating enough fiber-rich foods.   Stress or travel.  Lack of physical activity or exercise.  Not going to the restroom when there is the urge to have a bowel movement.  Ignoring the urge to have a bowel movement.  Using laxatives too much. SYMPTOMS   Having fewer than 3 bowel movements a week.   Straining to have a bowel movement.   Having hard, dry, or larger than normal stools.   Feeling full or bloated.   Pain in the lower abdomen.  Not feeling relief after having a bowel movement. DIAGNOSIS  Your caregiver will take a medical history and perform a physical exam. Further testing may be done for severe constipation. Some tests may include:   A barium enema X-ray to examine your rectum, colon, and sometimes, your small intestine.  A sigmoidoscopy to examine your lower colon.  A colonoscopy to examine your entire colon. TREATMENT  Treatment will depend on the severity of your constipation and what is causing it. Some dietary treatments include drinking more fluids and eating more fiber-rich foods. Lifestyle  treatments may include regular exercise. If these diet and lifestyle recommendations do not help, your caregiver may recommend taking over-the-counter laxative medicines to help you have bowel movements. Prescription medicines may be prescribed if over-the-counter medicines do not work.  HOME CARE INSTRUCTIONS   Increase dietary fiber in your diet, such as fruits, vegetables, whole grains, and beans. Limit high-fat and processed sugars in your diet, such as [place] fries, hamburgers, cookies, candies, and soda.   A fiber supplement may be added to your diet if you cannot get enough fiber from foods.   Drink enough fluids to keep your urine clear or pale yellow.   Exercise regularly or as directed by your caregiver.   Go to the restroom when you have the urge to go. Do not hold it.  Only take medicines as directed by your caregiver. Do not take other medicines for constipation without talking to your caregiver first. SEEK IMMEDIATE MEDICAL CARE IF:   You have bright red blood in your stool.   Your constipation lasts for more than 4 days or gets worse.   You have abdominal or rectal pain.   You have thin, pencil-like stools.  You have unexplained weight loss. MAKE SURE YOU:   Understand these instructions.  Will watch your condition.  Will get help right away if you are not doing  well or get worse. Document Released: 02/18/2004 Document Revised: 08/14/2011 Document Reviewed: 04/25/2011 [facility] Patient Information 2013 [place], [place].

## 2012-04-05 ENCOUNTER — Encounter: Payer: Self-pay | Admitting: Gastroenterology

## 2012-04-05 ENCOUNTER — Ambulatory Visit (AMBULATORY_SURGERY_CENTER): Payer: Medicaid Other | Admitting: Gastroenterology

## 2012-04-05 VITALS — BP 111/87 | HR 73 | Temp 98.4°F | Resp 19 | Ht 62.0 in | Wt 235.0 lb

## 2012-04-05 DIAGNOSIS — R079 Chest pain, unspecified: Secondary | ICD-10-CM

## 2012-04-05 DIAGNOSIS — K625 Hemorrhage of anus and rectum: Secondary | ICD-10-CM

## 2012-04-05 DIAGNOSIS — K5730 Diverticulosis of large intestine without perforation or abscess without bleeding: Secondary | ICD-10-CM

## 2012-04-05 DIAGNOSIS — K219 Gastro-esophageal reflux disease without esophagitis: Secondary | ICD-10-CM

## 2012-04-05 DIAGNOSIS — K5900 Constipation, unspecified: Secondary | ICD-10-CM

## 2012-04-05 MED ORDER — SODIUM CHLORIDE 0.9 % IV SOLN: 500.0000 mL | INTRAVENOUS | Status: DC

## 2012-04-05 NOTE — Op Note (Signed)
[facility] [address] [place], [address]   ENDOSCOPY PROCEDURE REPORT  PATIENT: Michelle, Trevino  MR#: [id number] BIRTHDATE: 07/17/1970 , 41  yrs. old GENDER: Female ENDOSCOPIST:Saige Canton Hale Bogus, MD, [facility] REFERRED BY: PROCEDURE DATE:  04/05/2012 PROCEDURE:   EGD w/ biopsy ASA CLASS:    Class III INDICATIONS: chest pain and history of esophageal reflux. MEDICATION: There was residual sedation effect present from prior procedure and propofol (Diprivan) 100mg  IV TOPICAL ANESTHETIC:   Cetacaine Spray  DESCRIPTION OF PROCEDURE:   After the risks and benefits of the procedure were explained, informed consent was obtained.  The LB GIF-H180 [id number]  endoscope was introduced through the mouth  and advanced to the second portion of the duodenum .  The instrument was slowly withdrawn as the mucosa was fully examined.    The upper, middle and distal third of the esophagus were carefully inspected and no abnormalities were noted.  The z-line was well seen at the GEJ.  The endoscope was pushed into the fundus which was normal including a retroflexed view.  The antrum, gastric body, first and second part of the duodenum were unremarkable. CLO B. DONE.Marland KitchenGRANULAR ANTRUM NOTED.   Retroflexed views revealed no abnormalities.    The scope was then withdrawn from the patient and the procedure completed.  COMPLICATIONS: There were no complications.   ENDOSCOPIC IMPRESSION: Normal EGD .Marland KitchenTREATED GERD,R/O H.PYLORI  RECOMMENDATIONS: 1.  continue PPI 2.  Await pathology results    _______________________________ eSigned:  Mardella Layman, MD, [facility] 04/05/2012 2:30 PM   standard discharge

## 2012-04-05 NOTE — Patient Instructions (Addendum)
YOU HAD AN ENDOSCOPIC PROCEDURE TODAY AT THE [facility]: Refer to the procedure report that was given to you for any specific questions about what was found during the examination.  If the procedure report does not answer your questions, please call your gastroenterologist to clarify.  If you requested that your care partner not be given the details of your procedure findings, then the procedure report has been included in a sealed envelope for you to review at your convenience later.  YOU SHOULD EXPECT: Some feelings of bloating in the abdomen. Passage of more gas than usual.  Walking can help get rid of the air that was put into your GI tract during the procedure and reduce the bloating. If you had a lower endoscopy (such as a colonoscopy or flexible sigmoidoscopy) you may notice spotting of blood in your stool or on the toilet paper. If you underwent a bowel prep for your procedure, then you may not have a normal bowel movement for a few days.  DIET: Your first meal following the procedure should be a light meal and then it is ok to progress to your normal diet.  A half-sandwich or bowl of soup is an example of a good first meal.  Heavy or fried foods are harder to digest and may make you feel nauseous or bloated.  Likewise meals heavy in dairy and vegetables can cause extra gas to form and this can also increase the bloating.  Drink plenty of fluids but you should avoid alcoholic beverages for 24 hours.  ACTIVITY: Your care partner should take you home directly after the procedure.  You should plan to take it easy, moving slowly for the rest of the day.  You can resume normal activity the day after the procedure however you should NOT DRIVE or use heavy machinery for 24 hours (because of the sedation medicines used during the test).    SYMPTOMS TO REPORT IMMEDIATELY: A gastroenterologist can be reached at any hour.  During normal business hours, 8:30 AM to 5:00 PM Monday through Friday,  call [phone].  After hours and on weekends, please call the GI answering service at [phone] who will take a message and have the physician on call contact you.   Following lower endoscopy (colonoscopy or flexible sigmoidoscopy):  Excessive amounts of blood in the stool  Significant tenderness or worsening of abdominal pains  Swelling of the abdomen that is new, acute  Fever of 100F or higher  Following upper endoscopy (EGD)  Vomiting of blood or coffee ground material  New chest pain or pain under the shoulder blades  Painful or persistently difficult swallowing  New shortness of breath  Fever of 100F or higher  Black, tarry-looking stools  FOLLOW UP: If any biopsies were taken you will be contacted by phone or by letter within the next 1-3 weeks.  Call your gastroenterologist if you have not heard about the biopsies in 3 weeks.  Our staff will call the home number listed on your records the next business day following your procedure to check on you and address any questions or concerns that you may have at that time regarding the information given to you following your procedure. This is a courtesy call and so if there is no answer at the home number and we have not heard from you through the emergency physician on call, we will assume that you have returned to your regular daily activities without incident.  SIGNATURES/CONFIDENTIALITY: You and/or your care   partner have signed paperwork which will be entered into your electronic medical record.  These signatures attest to the fact that that the information above on your After Visit Summary has been reviewed and is understood.  Full responsibility of the confidentiality of this discharge information lies with you and/or your care-partner.  

## 2012-04-05 NOTE — Op Note (Signed)
[facility] [address] [place], [address]   COLONOSCOPY PROCEDURE REPORT  PATIENT: Michelle Trevino, Michelle Trevino  MR#: [id number] BIRTHDATE: [birth year] , 41  yrs. old GENDER: Female ENDOSCOPIST: Mardella Layman, MD, [facility] REFERRED BY: PROCEDURE DATE:  04/05/2012 PROCEDURE:   Colonoscopy, screening ASA CLASS:   Class III INDICATIONS:average risk patient for colon cancer and hematochezia.  MEDICATIONS: propofol (Diprivan) 200mg  IV  DESCRIPTION OF PROCEDURE:   After the risks and benefits and of the procedure were explained, informed consent was obtained.  A digital rectal exam revealed no abnormalities of the rectum.    The LB CF-H180AL [id number]  endoscope was introduced through the anus and advanced to the cecum, which was identified by both the appendix and ileocecal valve .  The quality of the prep was excellent, using MoviPrep .  The instrument was then slowly withdrawn as the colon was fully examined.     COLON FINDINGS: A normal appearing cecum, ileocecal valve, and appendiceal orifice were identified.  The ascending, hepatic flexure, transverse, splenic flexure, descending, sigmoid colon and rectum appeared unremarkable.  No polyps or cancers were seen. Mild diverticulosis was noted in the sigmoid colon.     Retroflexed views revealed no abnormalities.     The scope was then withdrawn from the patient and the procedure completed.  COMPLICATIONS: There were no complications. ENDOSCOPIC IMPRESSION: 1.   Normal colon ,no polyps or cancer 2.   Mild diverticulosis was noted in the sigmoid colon  RECOMMENDATIONS: 1.  High fiber diet 2.  Continue current colorectal screening recommendations for "routine risk" patients with a repeat colonoscopy in 10 years.   REPEAT EXAM:  cc:  _______________________________ eSignedMardella Layman, MD, [facility] 04/05/2012 2:20 PM

## 2012-04-05 NOTE — Progress Notes (Signed)
1429 - To recovery awake and stable. VSS. Report to [place], Charity fundraiser

## 2012-04-05 NOTE — Progress Notes (Signed)
Patient did not experience any of the following events: a burn prior to discharge; a fall within the facility; wrong site/side/patient/procedure/implant event; or a hospital transfer or hospital admission upon discharge from the facility. (G8907) Patient did not have preoperative order for IV antibiotic SSI prophylaxis. (G8918)  

## 2012-04-08 ENCOUNTER — Telehealth: Payer: Self-pay | Admitting: *Deleted

## 2012-04-08 NOTE — Telephone Encounter (Signed)
  Follow up Call-  Call back number 04/05/2012  Post procedure Call Back phone  # [phone]  Permission to leave phone message Yes     Left message on number given in admitting on Friday per pt. ewm

## 2012-04-09 ENCOUNTER — Encounter: Payer: Self-pay | Admitting: Gastroenterology

## 2012-04-18 ENCOUNTER — Other Ambulatory Visit: Payer: Self-pay | Admitting: *Deleted

## 2012-04-18 DIAGNOSIS — K5900 Constipation, unspecified: Secondary | ICD-10-CM

## 2012-04-18 DIAGNOSIS — K625 Hemorrhage of anus and rectum: Secondary | ICD-10-CM

## 2012-04-18 MED ORDER — LINACLOTIDE 145 MCG PO CAPS: 145.0000 ug | ORAL_CAPSULE | Freq: Every day | ORAL | Status: DC

## 2012-04-18 NOTE — Telephone Encounter (Signed)
Refill request from [facility] via fax

## 2012-05-07 ENCOUNTER — Telehealth: Payer: Self-pay | Admitting: Gastroenterology

## 2012-05-08 ENCOUNTER — Telehealth: Payer: Self-pay | Admitting: *Deleted

## 2012-05-08 MED ORDER — LUBIPROSTONE 8 MCG PO CAPS: 8.0000 ug | ORAL_CAPSULE | Freq: Two times a day (BID) | ORAL | Status: DC

## 2012-05-08 NOTE — Telephone Encounter (Signed)
Called [facility] because patient stated that [facility] did not have her prescription for Linzess.  I spoke with Adela Glimpse and he stated that he did not see prescription so I did RX over the phone with pharmacist.  Advised patient RX was called in. Patient verbalized understanding

## 2012-05-08 NOTE — Telephone Encounter (Signed)
Called Medicaid at [phone] and Michelle Trevino is not covered by Medicaid. Patient will have to pay out of pocket.  I called patient and advised her that I will send RX for Amitiza 8 MCG for her to take one capsule by mouth twice daily because Linzess is not covered.  Patient verbalized understanding and will call if there are any further problems

## 2012-05-13 ENCOUNTER — Other Ambulatory Visit: Payer: Self-pay | Admitting: Family Medicine

## 2012-05-16 ENCOUNTER — Other Ambulatory Visit: Payer: Self-pay | Admitting: Family Medicine

## 2012-05-20 ENCOUNTER — Other Ambulatory Visit: Payer: Self-pay | Admitting: Family Medicine

## 2012-05-21 ENCOUNTER — Other Ambulatory Visit (HOSPITAL_COMMUNITY)
Admission: RE | Admit: 2012-05-21 | Discharge: 2012-05-21 | Disposition: A | Discharge status: Home / Self Care | Payer: Medicaid Other | Source: Ambulatory Visit | Attending: Family Medicine | Admitting: Family Medicine

## 2012-05-21 ENCOUNTER — Ambulatory Visit (INDEPENDENT_AMBULATORY_CARE_PROVIDER_SITE_OTHER): Payer: Medicaid Other | Admitting: Family Medicine

## 2012-05-21 ENCOUNTER — Encounter: Payer: Self-pay | Admitting: Family Medicine

## 2012-05-21 VITALS — BP 130/77 | HR 97 | Temp 98.9°F | Wt 238.0 lb

## 2012-05-21 DIAGNOSIS — Z113 Encounter for screening for infections with a predominantly sexual mode of transmission: Secondary | ICD-10-CM | POA: Insufficient documentation

## 2012-05-21 DIAGNOSIS — N978 Female infertility of other origin: Secondary | ICD-10-CM | POA: Insufficient documentation

## 2012-05-21 DIAGNOSIS — N760 Acute vaginitis: Secondary | ICD-10-CM

## 2012-05-21 DIAGNOSIS — N898 Other specified noninflammatory disorders of vagina: Secondary | ICD-10-CM

## 2012-05-21 DIAGNOSIS — I10 Essential (primary) hypertension: Secondary | ICD-10-CM

## 2012-05-21 LAB — POCT WET PREP (WET MOUNT)

## 2012-05-21 MED ORDER — FLUCONAZOLE 150 MG PO TABS: 150.0000 mg | ORAL_TABLET | Freq: Once | ORAL | Status: DC

## 2012-05-21 NOTE — Assessment & Plan Note (Signed)
Doing well on HCTZ 25 and propranolol 20 mg (from neurology for migraine)  Will continue current meds.

## 2012-05-21 NOTE — Progress Notes (Signed)
  Subjective:    Patient ID: Michelle Trevino, female    DOB: 09/01/1970, 41 y.o.   MRN: [id number]  HPI 41 yo here for evaluation of possible vaginal yeast infection  Reports antibiotic use finished last week after a dental procedure.  About that time, started with vaginal itching and thin, white vaginal discharge.  No dyspareunia, fever, dysuria, abdominal pain  HYPERTENSION  BP Readings from Last 3 Encounters:  05/21/12 130/77  04/05/12 111/87  04/02/12 110/70    Hypertension ROS: taking medications as instructed, no medication side effects noted, no TIA's, no chest pain on exertion, no dyspnea on exertion and no intermittent claudication symptoms.   Reports taking both propranolol and metoprolol in the past, but has been out of metoprolol (neurologist prescribed propranolol for migraines)   I have reviewed patient's  PMH, FH, and Social history and Medications as related to this visit.  Review of Systemssee HPi     Objective:   Physical Exam GEN; NAD, vitals reviewed Pelvic Exam:        External: normal female genitalia without lesions or masses        Vagina: normal without lesions or masses        Cervix: normal without lesions or masses        Adnexa: normal bimanual exam without masses or fullness        Uterus: normal by palpation        Samples for Wet prep, GC/Chlamydia obtained       Assessment & Plan:

## 2012-05-21 NOTE — Patient Instructions (Addendum)
Will call you with results

## 2012-05-22 ENCOUNTER — Encounter: Payer: Self-pay | Admitting: Family Medicine

## 2012-05-22 NOTE — Assessment & Plan Note (Signed)
Candidal yeast infection.  GC/Chl pending.  Rx fluconazole.

## 2012-06-10 ENCOUNTER — Other Ambulatory Visit: Payer: Self-pay | Admitting: Family Medicine

## 2012-07-11 ENCOUNTER — Telehealth: Payer: Self-pay | Admitting: Gastroenterology

## 2012-07-11 ENCOUNTER — Other Ambulatory Visit: Payer: Self-pay | Admitting: Gastroenterology

## 2012-07-11 ENCOUNTER — Other Ambulatory Visit: Payer: Self-pay | Admitting: Family Medicine

## 2012-07-11 DIAGNOSIS — N6459 Other signs and symptoms in breast: Secondary | ICD-10-CM

## 2012-07-26 ENCOUNTER — Ambulatory Visit
Admission: RE | Admit: 2012-07-26 | Discharge: 2012-07-26 | Disposition: A | Discharge status: Home / Self Care | Payer: Medicaid Other | Source: Ambulatory Visit | Attending: Family Medicine | Admitting: Family Medicine

## 2012-08-22 ENCOUNTER — Encounter: Payer: Self-pay | Admitting: Family Medicine

## 2012-08-22 ENCOUNTER — Ambulatory Visit (INDEPENDENT_AMBULATORY_CARE_PROVIDER_SITE_OTHER): Payer: Medicaid Other | Admitting: Family Medicine

## 2012-08-22 VITALS — BP 125/89 | HR 97 | Temp 97.8°F | Ht 61.25 in | Wt 236.9 lb

## 2012-08-22 DIAGNOSIS — R112 Nausea with vomiting, unspecified: Secondary | ICD-10-CM | POA: Insufficient documentation

## 2012-08-22 MED ORDER — ONDANSETRON HCL 4 MG PO TABS: 4.0000 mg | ORAL_TABLET | Freq: Three times a day (TID) | ORAL | Status: DC | PRN

## 2012-08-22 MED ORDER — POLYETHYLENE GLYCOL 3350 17 G PO PACK: 17.0000 g | PACK | Freq: Two times a day (BID) | ORAL | Status: DC

## 2012-08-22 NOTE — Progress Notes (Signed)
  Subjective:    Patient ID: Michelle Trevino, female    DOB: 1971/03/04, 42 y.o.   MRN: [id number]  Chest Pain   Constipation    1. Nausea & emesis. Started 2 days ago. Her mother came down with similar symptoms 2 days previous to her with nausea, emesis and diarrhea. Patient is having some indigestion and abdominal cramping, but she thinks her chronic constipation may be prohibiting her from having the diarrhea. Her last BM was 2 days ago, was large and required significant straining, painful to pass, had some mild blood streaking on stool which is typical for her (had GI workup in the past 4 months). Last emesis was yesterday. She ate small amount of pizza today and is drinking plenty of fluids. She also endorses feeling of lump in her throat and a mild left sternal chest pain since yesterday morning, started gradually. It is worse when she touches the spot near her left sternum, otherwise not related to rest or exertion, no dyspnea, cough, fever, chills, palpitations, diaphoresis, weight loss, abdominal pain.   Review of Systems  Cardiovascular: Positive for chest pain.  Gastrointestinal: Positive for constipation.   See HPI otherwise negative.  reports that she quit smoking about 7 weeks ago. She has never used smokeless tobacco.     Objective:   Physical Exam  Vitals reviewed. Constitutional: She is oriented to person, place, and time. She appears well-developed and well-nourished. No distress.  Appears well, is laughing with her friend in the room.  HENT:  Head: Normocephalic and atraumatic.  Mouth/Throat: Oropharynx is clear and moist. No oropharyngeal exudate.  Eyes: EOM are normal. Pupils are equal, round, and reactive to light.  Neck: No thyromegaly present.  Cardiovascular: Normal rate, regular rhythm and normal heart sounds.   Pulmonary/Chest: Effort normal and breath sounds normal. No respiratory distress. She has no wheezes. She has no rales.  TTP in left chest sterno-costal  margin.  Abdominal: Soft. She exhibits no mass. There is no rebound and no guarding.  Obese, slight distension. BS +. Slight epigastric TTP  Musculoskeletal: She exhibits no edema.  Lymphadenopathy:    She has no cervical adenopathy.  Neurological: She is alert and oriented to person, place, and time.  Skin: She is not diaphoretic.  Psychiatric: She has a normal mood and affect.          Assessment & Plan:

## 2012-08-22 NOTE — Patient Instructions (Addendum)
You probably have a gastroenteritis. Try zofran 4-8mg  every 8 hrs as needed. Add the miralax twice daily. Make appointment with Dr. Earnest Bailey in next one week for check up. If you have worsened chest pain, uncontrolled vomiting or worsened symptoms then call doctor or go to the ER.

## 2012-08-22 NOTE — Assessment & Plan Note (Addendum)
Likely related to viral gastroenteritis with exposure to her mother. Has chronic constipation and decreased gut motility that may be exacerbating her cramping currently. Emesis also likely triggered some esophageal spasm or costo-chondritis causing the atypical pain in chest wall. She no cardiac risk factors and pain is reproduced on exam.  Recommend stool bulking agent-miralax or metamucil with her chronic constipation medication, zofran prn for nausea. Continue PPI. She is keeping hydrated and kept down her last meal and there is no indication for further lab testing or imaging at this time. Recommend she f/u with PCP in one week if still having symptoms, or seek emergency care if worsens.

## 2012-09-02 NOTE — Telephone Encounter (Signed)
See previous note

## 2012-10-16 ENCOUNTER — Ambulatory Visit (INDEPENDENT_AMBULATORY_CARE_PROVIDER_SITE_OTHER): Payer: Medicaid Other | Admitting: Neurology

## 2012-10-16 ENCOUNTER — Encounter: Payer: Self-pay | Admitting: Neurology

## 2012-10-16 VITALS — BP 139/91 | HR 96 | Wt 256.0 lb

## 2012-10-16 DIAGNOSIS — R011 Cardiac murmur, unspecified: Secondary | ICD-10-CM | POA: Insufficient documentation

## 2012-10-16 DIAGNOSIS — O24419 Gestational diabetes mellitus in pregnancy, unspecified control: Secondary | ICD-10-CM | POA: Insufficient documentation

## 2012-10-16 DIAGNOSIS — G43909 Migraine, unspecified, not intractable, without status migrainosus: Secondary | ICD-10-CM | POA: Insufficient documentation

## 2012-10-16 DIAGNOSIS — J45909 Unspecified asthma, uncomplicated: Secondary | ICD-10-CM | POA: Insufficient documentation

## 2012-10-16 DIAGNOSIS — G43719 Chronic migraine without aura, intractable, without status migrainosus: Secondary | ICD-10-CM

## 2012-10-16 DIAGNOSIS — T7840XA Allergy, unspecified, initial encounter: Secondary | ICD-10-CM | POA: Insufficient documentation

## 2012-10-16 DIAGNOSIS — M1990 Unspecified osteoarthritis, unspecified site: Secondary | ICD-10-CM

## 2012-10-16 DIAGNOSIS — K219 Gastro-esophageal reflux disease without esophagitis: Secondary | ICD-10-CM | POA: Insufficient documentation

## 2012-10-16 DIAGNOSIS — F419 Anxiety disorder, unspecified: Secondary | ICD-10-CM | POA: Insufficient documentation

## 2012-10-16 MED ORDER — ONABOTULINUMTOXINA 100 UNITS IJ SOLR
150.0000 [IU] | Freq: Once | INTRAMUSCULAR | Status: AC
  Administered 2012-10-16: 150 [IU] via INTRAMUSCULAR

## 2012-10-16 NOTE — Progress Notes (Signed)
  History of Present Illness  HPI: [name] is a 42 year old right-handed African American female, referred by her primary care physician for evaluation of headaches  She has past medical history of headaches for 6 years, typical migraine is bilateral retro-orbital severe pounding headaches, with associated light noise sensitivity, worsening by movement, improved by sleeping dark quiet room, she has couple typical migraine headaches a month, there is no clear triggering event,  In addition she complains of almost daily bilateral temporal area mild pressure headaches, over the years, she has tried different medications, including morbid, Topamax, Zomig, Imitrex without helping her headaches   She is tolerating nortriptyline, no significant side effects, she has mild improvement of her headaches, she continued to have daily mild bilateral frontal headache, takes Tylenol occasionally, one to 2 times a week, she would have much more severe pounding headaches, her typical migraine headaches, if she catches it early on, Maxalt has been helpful, if she was not able to abort her migraine early, she may develop more prolonged severe headaches, her headache has to run through its course, lasting one to 2 hours, sleep usually helps.  She continues to have 2-3 headaches each week, Maxalt helps most of the time, she is also taking propanolol 40 mg twice a day, nortriptyline 25 mg every night, she complains of anxiety, unresting sleep, hope better control of her headaches  UPDATE May 14th 2014: She came in for Botox injection as migraine prevention, this is her first time to receive injection   Physical Exam  Neck: supple no carotid bruits Respiratory: clear to auscultation bilaterally Cardiovascular: regular rate rhythm  Neurologic Exam  Mental Status: pleasant, awake, alert, cooperative to history, talking, and casual conversation. Cranial Nerves: CN II-XII pupils were equal round reactive to light.  Fundi  were sharp bilaterally.  Extraocular movements were full.  Visual fields were full on confrontational test.  Facial sensation and strength were normal.  Hearing was intact to finger rubbing bilaterally.  Uvula tongue were midline.  Head turning and shoulder shrugging were normal and symmetric.  Tongue protrusion into the cheeks strength were normal.  Motor: Normal tone, bulk, and strength. Sensory: Normal to light touch, pinprick, proprioception, and vibratory sensation. Coordination: Normal finger-to-nose, heel-to-shin.  There was no dysmetria noticed. Gait and Station: Narrow based and steady, was able to perform tiptoe, heel, and tandem walking without difficulty.  Romberg sign: Negative Reflexes: Deep tendon reflexes: Biceps: 2/2, Brachioradialis: 2/2, Triceps: 2/2, Pateller: 2/2, Achilles: 2/2.  Plantar responses are flexor.   Assessment and Plan: Botox injection as migraine prevention   BOTOX injection was performed according to protocol by [employer]. 100 units of BOTOX was dissolved into 2 cc NS. (Lot No.[id number], [id number] C3, 05/2015)  total of 150 units,   Corrugator 2 sites, 10 units Procerus 1 site, 5 unit Frontalis 4 sites,  20 units, Temporalis 8 sites,  40 units  Occipitalis 6 sites, 30 units Cervical Paraspinal, 4 sites, 20 units Trapezius, 6 sites, 30 units  Patient tolerate the injection well. Will return for repeat injection in 3 months.

## 2012-10-20 ENCOUNTER — Other Ambulatory Visit: Payer: Self-pay | Admitting: Family Medicine

## 2012-10-20 ENCOUNTER — Other Ambulatory Visit: Payer: Self-pay | Admitting: Gastroenterology

## 2012-10-22 ENCOUNTER — Encounter: Payer: Self-pay | Admitting: Family Medicine

## 2012-10-22 ENCOUNTER — Ambulatory Visit (INDEPENDENT_AMBULATORY_CARE_PROVIDER_SITE_OTHER): Payer: Medicaid Other | Admitting: Family Medicine

## 2012-10-22 VITALS — BP 136/84 | HR 111 | Ht 61.25 in | Wt 257.0 lb

## 2012-10-22 DIAGNOSIS — L219 Seborrheic dermatitis, unspecified: Secondary | ICD-10-CM | POA: Insufficient documentation

## 2012-10-22 DIAGNOSIS — L218 Other seborrheic dermatitis: Secondary | ICD-10-CM

## 2012-10-22 MED ORDER — KETOCONAZOLE 2 % EX SHAM: MEDICATED_SHAMPOO | CUTANEOUS | Status: DC

## 2012-10-22 NOTE — Progress Notes (Signed)
  Subjective:    Patient ID: Michelle Trevino, female    DOB: May 26, 1971, 42 y.o.   MRN: [id number]  HPI Pt here for same day appointment for dandruff  Patient states that she has had bad dandruff her entire life. It has been much worse for the last week when she has had increased itching, scaling, and some large clumps of dead skin when she scratches and picks at her scalp.  She has tried several shampoos previously without much help including selsun blue, head and shoulders, t gel, and garlic shampoo. She is currently using garlic shampoo.  She usually washes her hair 2 times a week and has washed it once extra this week.   She denies fevers, chills, loss of appetite or other serious concerns.    Review of Systems Per HPI     Objective:   Physical Exam  Gen: NAD, alert, cooperative with exam HEENT: NCAT, on inspetion of head numerous small white flakes are visible. When moving her hair and looking at her scalp she has several small areas ( approx 2-5 mm in diameter) areas of papular white dry areas consistent with hyperkeratosis.  Skin: No scaling on elbows or arms, hands with prominent creases but no obvious scaling.         Assessment & Plan:

## 2012-10-22 NOTE — Patient Instructions (Signed)
Thanks for coming in today, it was great to meet you!  You have a dandruff, also known as seborrheic dermatitis. The antifungal shampoo should help.

## 2012-10-22 NOTE — Assessment & Plan Note (Signed)
Chronic problem with a  Current exacerbation I explained that this is due to thriving normal fungus malessezia furfur and may need occasional increased treatment Encouraged alternating selsun blue with head and shoulders for chronic control Given 2% ketoconazole shampoo with directions to leave for 5-10 min and use once daily at least 3 times a week up to daily.  Considered steroid shampoo but patient was concerned with cost so I feel this will likely treat the source of the exacerbation and her primary complaint was scaling not itching

## 2012-11-03 HISTORY — PX: LIPOMA RESECTION: SHX23

## 2012-12-24 ENCOUNTER — Other Ambulatory Visit: Payer: Self-pay | Admitting: Family Medicine

## 2012-12-25 NOTE — Telephone Encounter (Signed)
Refill declined as per last encounter with previous PCP she had suggested other nonpharmacologic measure for her insomnia. I can have her try hydroxyzine if she wants.

## 2013-01-22 ENCOUNTER — Encounter: Payer: Self-pay | Admitting: Neurology

## 2013-01-22 ENCOUNTER — Ambulatory Visit (INDEPENDENT_AMBULATORY_CARE_PROVIDER_SITE_OTHER): Payer: Medicaid Other | Admitting: Neurology

## 2013-01-22 ENCOUNTER — Telehealth: Payer: Self-pay | Admitting: Neurology

## 2013-01-22 VITALS — BP 145/91 | HR 87 | Ht 61.0 in | Wt 258.0 lb

## 2013-01-22 DIAGNOSIS — F4310 Post-traumatic stress disorder, unspecified: Secondary | ICD-10-CM

## 2013-01-22 DIAGNOSIS — G43909 Migraine, unspecified, not intractable, without status migrainosus: Secondary | ICD-10-CM

## 2013-01-22 DIAGNOSIS — E669 Obesity, unspecified: Secondary | ICD-10-CM

## 2013-01-22 DIAGNOSIS — F411 Generalized anxiety disorder: Secondary | ICD-10-CM

## 2013-01-22 DIAGNOSIS — G43719 Chronic migraine without aura, intractable, without status migrainosus: Secondary | ICD-10-CM

## 2013-01-22 MED ORDER — ONABOTULINUMTOXINA 100 UNITS IJ SOLR
150.0000 [IU] | Freq: Once | INTRAMUSCULAR | Status: AC
  Administered 2013-01-22: 150 [IU] via INTRAMUSCULAR

## 2013-01-22 NOTE — Progress Notes (Signed)
  History of Present Illness  HPI: [name] is a 42 year old right-handed African American female, referred by her primary care physician for evaluation of headaches  She has past medical history of headaches for 6 years, typical migraine is bilateral retro-orbital severe pounding headaches, with associated light noise sensitivity, worsening by movement, improved by sleeping dark quiet room, she has couple typical migraine headaches a month, there is no clear triggering event,  In addition she complains of almost daily bilateral temporal area mild pressure headaches, over the years, she has tried different medications, including morbid, Topamax, Zomig, Imitrex without helping her headaches   She is tolerating nortriptyline, no significant side effects, she has mild improvement of her headaches, she continued to have daily mild bilateral frontal headache, takes Tylenol occasionally, one to 2 times a week, she would have much more severe pounding headaches, her typical migraine headaches, if she catches it early on, Maxalt has been helpful, if she was not able to abort her migraine early, she may develop more prolonged severe headaches, her headache has to run through its course, lasting one to 2 hours, sleep usually helps.  She continues to have 2-3 headaches each week, Maxalt helps most of the time, she is also taking propanolol 40 mg twice a day, nortriptyline 25 mg every night, she complains of anxiety, unresting sleep, hope better control of her headaches  UPDATE August 20th 2014:  She came in for Botox injection as migraine prevention, last injection was in May 2014, she responded very well, reported 80% improvement, works for couple months, during those 2 months, she only has headaches occasionally, she began to have recurrent headache over past month, 3-4 times each week.   Physical Exam  Neck: supple no carotid bruits Respiratory: clear to auscultation bilaterally Cardiovascular: regular  rate rhythm  Neurologic Exam  Mental Status: pleasant, awake, alert, cooperative to history, talking, and casual conversation. Cranial Nerves: CN II-XII pupils were equal round reactive to light.  Fundi were sharp bilaterally.  Extraocular movements were full.  Visual fields were full on confrontational test.  Facial sensation and strength were normal.  Hearing was intact to finger rubbing bilaterally.  Uvula tongue were midline.  Head turning and shoulder shrugging were normal and symmetric.  Tongue protrusion into the cheeks strength were normal.  Motor: Normal tone, bulk, and strength. Sensory: Normal to light touch, pinprick, proprioception, and vibratory sensation. Coordination: Normal finger-to-nose, heel-to-shin.  There was no dysmetria noticed. Gait and Station: Narrow based and steady, was able to perform tiptoe, heel, and tandem walking without difficulty.  Romberg sign: Negative Reflexes: Deep tendon reflexes: Biceps: 2/2, Brachioradialis: 2/2, Triceps: 2/2, Pateller: 2/2, Achilles: 2/2.  Plantar responses are flexor.  Assessment and Plan: Botox injection as migraine prevention   BOTOX injection was performed according to protocol by [employer]. 100 units of BOTOX was dissolved into 2 cc NS. (Lot [id number], Exp Feb 2017)  Total of 150 units    Corrugator 2 sites, 10 units Frontalis 4 sites,  20 units, Temporalis 8 sites,  40 units  Occipitalis 6 sites, 30 units Cervical Paraspinal, 4 sites, 20 units Trapezius, 6 sites, 30 units  Patient tolerate the injection well. Will return for repeat injection in 3 months.

## 2013-01-23 NOTE — Telephone Encounter (Signed)
Dr Terrace Arabia, Arville Lime is requesting a Rx for Zofran and Ambien.  It does not appear we have ever prescribed either of these medications.  Would you like to prescribe, or should she refer to whomever prescribed them previously?  Please advise.  Thank you.

## 2013-01-28 NOTE — Telephone Encounter (Signed)
I spoke with the patient.  She said she got the nausea meds from Urgent Care and Ambien form her PCP.  I recommended she contact her PCP for refills on Ambien.  She agreed.  By looking further into the chart, I see that the patients family doctor prescribed Zofran in the past.  Patient has agreed to contact them to get a refill.  She said she just thought she'd ask [place] to refill her meds since she had an OV here.

## 2013-02-12 ENCOUNTER — Encounter (HOSPITAL_COMMUNITY): Payer: Self-pay | Admitting: *Deleted

## 2013-02-12 ENCOUNTER — Emergency Department (HOSPITAL_COMMUNITY)
Admission: EM | Admit: 2013-02-12 | Discharge: 2013-02-12 | Disposition: A | Discharge status: Home / Self Care | Payer: Medicaid Other | Source: Home / Self Care | Attending: Family Medicine | Admitting: Family Medicine

## 2013-02-12 DIAGNOSIS — R091 Pleurisy: Secondary | ICD-10-CM

## 2013-02-12 MED ORDER — HYDROCODONE-ACETAMINOPHEN 5-325 MG PO TABS: 2.0000 | ORAL_TABLET | ORAL | Status: DC | PRN

## 2013-02-12 MED ORDER — IBUPROFEN 800 MG PO TABS: 800.0000 mg | ORAL_TABLET | Freq: Three times a day (TID) | ORAL | Status: DC

## 2013-02-12 NOTE — ED Provider Notes (Signed)
CSN: [id number]     Arrival date & time 02/12/13  1923 History  P First MD Initiated Contact with Patient 02/12/13 2030     No chief complaint on file.  (Consider location/radiation/quality/duration/timing/severity/associated sxs/prior Treatment) Patient is a 42 y.o. female presenting with chest pain. The history is provided by the patient. No language interpreter was used.  Chest Pain Pain location:  Substernal area Pain quality: aching   Pain radiates to:  Does not radiate Pain severity:  Moderate Timing:  Constant Chronicity:  New Relieved by:  Nothing Worsened by:  Nothing tried Associated symptoms: no abdominal pain   Risk factors: no coronary artery disease and no diabetes mellitus   Pt reports pain in her chest with inspiration for 3 days.  No cough no fever,   Pt denies feeling short of breath.    Past Medical History  Diagnosis Date  . Polyuria   . Hemorrhoids   . Migraines   . Anxiety   . Asthma   . Gestational diabetes   . Hypertension   . Allergy   . Arthritis   . GERD (gastroesophageal reflux disease)   . Heart murmur    Past Surgical History  Procedure Laterality Date  . Tubal ligation  2000  . Reversal of tubal ligation  2011   Family History  Problem Relation Age of Onset  . Diabetes Mother   . Hypertension Mother   . Osteoarthritis Mother   . Stroke Mother 8  . Hepatitis Mother   . Stomach cancer Father 5  . Stroke Brother 40  . Cancer Maternal Grandmother     unsure what type  . Colon cancer Neg Hx   . Esophageal cancer Neg Hx   . Rectal cancer Neg Hx    History  Substance Use Topics  . Smoking status: Former Smoker -- 0.50 packs/day    Quit date: 06/29/2012  . Smokeless tobacco: Never Used  . Alcohol Use: 2.4 oz/week    4 Glasses of wine per week     Comment: socially   OB History   Grav Para Term Preterm Abortions TAB SAB Ect Mult Living   1  0 0   0 0 0      Obstetric Comments   [id number] 5 TAB's     Review of Systems   Cardiovascular: Positive for chest pain.  Gastrointestinal: Negative for abdominal pain.  All other systems reviewed and are negative.    Allergies  Review of patient's allergies indicates no known allergies.  Home Medications   Current Outpatient Rx  Name  Route  Sig  Dispense  Refill  . albuterol (VENTOLIN HFA) 108 (90 BASE) MCG/ACT inhaler   Inhalation   Inhale 2 puffs into the lungs every 4 (four) hours as needed. For shortness of breath or wheezing          . AMITIZA 8 MCG capsule      take 1 capsule by mouth twice a day with food   60 capsule   2   . hydrochlorothiazide (HYDRODIURIL) 25 MG tablet      take 1 tablet by mouth once daily   30 tablet   6   . ketoconazole (NIZORAL) 2 % shampoo   Topical   Apply topically 2 (two) times a week. Use daily to 3 times per week   120 mL   0   . nortriptyline (PAMELOR) 10 MG capsule   Oral   Take 20 mg by mouth at bedtime. Prescribed  by neurology         . omeprazole (PRILOSEC) 20 MG capsule   Oral   Take 1 capsule (20 mg total) by mouth daily.   30 capsule   5   . ondansetron (ZOFRAN) 4 MG tablet   Oral   Take 1-2 tablets (4-8 mg total) by mouth every 8 (eight) hours as needed for nausea.   20 tablet   0   . polyethylene glycol powder (GLYCOLAX/MIRALAX) powder      take 17GM (DISSOLVED IN WATER) by mouth twice a day   527 g   1   . propranolol (INDERAL) 40 MG tablet   Oral   Take 40 mg by mouth 2 (two) times daily.         . rizatriptan (MAXALT) 10 MG tablet   Oral   Take 10 mg by mouth as needed. May repeat in 2 hours if needed         . sertraline (ZOLOFT) 50 MG tablet   Oral   Take 50 mg by mouth daily.         Marland Kitchen zolpidem (AMBIEN) 5 MG tablet      take 1 tablet by mouth at bedtime if needed for sleep **TAKE 1 TAB 10 TO 20 MINUTES PRIOR TO BEDTIME   30 tablet   0    BP 139/89  Pulse 92  Temp(Src) 98.3 F (36.8 C) (Oral)  Resp 19  SpO2 96% Physical Exam  Nursing note and  vitals reviewed. Constitutional: She is oriented to person, place, and time. She appears well-developed and well-nourished.  HENT:  Head: Normocephalic and atraumatic.  Eyes: EOM are normal. Pupils are equal, round, and reactive to light.  Neck: Normal range of motion.  Cardiovascular: Normal rate and normal heart sounds.   Pulmonary/Chest: Effort normal and breath sounds normal.  Abdominal: She exhibits no distension.  Musculoskeletal: Normal range of motion.  Neurological: She is alert and oriented to person, place, and time.  Skin: Skin is warm.  Psychiatric: She has a normal mood and affect.    ED Course  Procedures (including critical care time) Labs Review Labs Reviewed - No data to display Imaging Review No results found. EKg shows LVH  No st changes.  I doubt pneumonia,  Doubt pe.   Pt given rx for ibuprofen and hydrocodone.  Pt advised to see her MD for evaluation and recheck in 2-3 days MDM   1. Pleurisy       Elson Areas, PA-C 02/12/13 2103

## 2013-02-12 NOTE — ED Notes (Signed)
C/o chest pain onset 4 days ago while watching TV.  Pain continuous.  States she feels like she gets over exerted really fast but no SOB like when she has asthma.  C/o nausea and sweating.  No cold symptoms or fever.  Pain worse with movement and palpation.

## 2013-02-12 NOTE — Discharge Instructions (Signed)
Pleurisy  Pleurisy is an inflammation and swelling of the lining of the lungs. It usually is the result of an underlying infection or other disease. Because of this inflammation, it hurts to breathe. It is aggravated by coughing or deep breathing. The primary goal in treating pleurisy is to diagnose and treat the condition that caused it.   HOME CARE INSTRUCTIONS   · Only take over-the-counter or prescription medicines for pain, discomfort, or fever as directed by your caregiver.  · If medications which kill germs (antibiotics) were prescribed, take the entire course. Even if you are feeling better, you need to take them.  · Use a cool mist vaporizer to help loosen secretions. This is so the secretions can be coughed up more easily.  SEEK MEDICAL CARE IF:   · Your pain is not controlled with medication or is increasing.  · You have an increase inpus like (purulent) secretions brought up with coughing.  SEEK IMMEDIATE MEDICAL CARE IF:   · You have blue or dark lips, fingernails, or toenails.  · You begin coughing up blood.  · You have increased difficulty breathing.  · You have continuing pain unrelieved by medicine or lasting more than 1 week.  · You have pain that radiates into your neck, arms, or jaw.  · You develop increased shortness of breath or wheezing.  · You develop a fever, rash, vomiting, fainting, or other serious complaints.  Document Released: 05/22/2005 Document Revised: 08/14/2011 Document Reviewed: 12/21/2006  Exit[employer]® Patient Information ©2014 [employer].

## 2013-02-13 NOTE — ED Provider Notes (Signed)
Medical screening examination/treatment/procedure(s) were performed by resident physician or non-physician practitioner and as supervising physician I was immediately available for consultation/collaboration.   Barkley Bruns MD.   Linna Hoff, MD 02/13/13 [phone]

## 2013-02-14 ENCOUNTER — Encounter: Payer: Self-pay | Admitting: Family Medicine

## 2013-02-14 ENCOUNTER — Ambulatory Visit (INDEPENDENT_AMBULATORY_CARE_PROVIDER_SITE_OTHER): Payer: Medicaid Other | Admitting: Family Medicine

## 2013-02-14 VITALS — BP 154/81 | HR 108 | Temp 98.7°F | Wt 259.0 lb

## 2013-02-14 DIAGNOSIS — Z8632 Personal history of gestational diabetes: Secondary | ICD-10-CM

## 2013-02-14 DIAGNOSIS — R071 Chest pain on breathing: Secondary | ICD-10-CM

## 2013-02-14 DIAGNOSIS — I10 Essential (primary) hypertension: Secondary | ICD-10-CM

## 2013-02-14 DIAGNOSIS — R0789 Other chest pain: Secondary | ICD-10-CM

## 2013-02-14 MED ORDER — DICLOFENAC SODIUM 75 MG PO TBEC: 75.0000 mg | DELAYED_RELEASE_TABLET | Freq: Two times a day (BID) | ORAL | Status: DC

## 2013-02-14 MED ORDER — LISINOPRIL-HYDROCHLOROTHIAZIDE 20-25 MG PO TABS: 1.0000 | ORAL_TABLET | Freq: Every day | ORAL | Status: DC

## 2013-02-14 NOTE — Progress Notes (Signed)
Patient ID: Michelle Trevino, female   DOB: 30-Sep-1970, 42 y.o.   MRN: [id number]  [facility] Ebert Forrester M. Gerasimos Plotts, MD Phone: [phone]   Subjective: HPI: Patient is a 42 y.o. female presenting to clinic today for same day for urgent care follow up.  Chest pain- Sharp pain, worse with deep breath. Going on for 6 days. She was seen at Urgent Care and was diagnosed with pleurisy. She states the pain is currently a consistent 5/10. She has a history of enlarged heart at the [place] 4 years ago on echocardiogram. She was recommended to have yearly check up, never established care with a cardiologist. No syncope, endorses palpitations and and SOB.   Has history of HTN, on HCTZ. BP 154/81 today.  History of surgery on foot in June, still having numbness of foot. 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   History Reviewed: Former smoker.  ROS: Please see HPI above.  Objective: Office vital signs reviewed. BP 154/81  Pulse 108  Temp(Src) 98.7 F (37.1 C) (Oral)  Wt 259 lb (117.482 kg)  BMI 48.96 kg/m2  LMP 01/23/2013  Physical Examination:  General: Awake, alert. NAD HEENT: Atraumatic, normocephalic Neck: No masses palpated. No LAD Pulm: CTAB, no wheezes Cardio: RRR, no murmurs appreciated. Mild chest wall tenderness. Abdomen:+BS, soft, nontender, nondistended Extremities: No edema Neuro: Grossly intact  Assessment: 42 y.o. female with HTN and chest discomfort  Plan: See Problem List and After Visit Summary

## 2013-02-14 NOTE — Patient Instructions (Addendum)
We are starting a new medication for you. When you run out of HCTZ, start taking Lisinopril-HCTZ daily. If you have lip swelling, difficulty breathing or a rash please stop the medication and let us know.  We will make an appointment with the cardiologist and let you know when that is.   Please come back to check your blood pressures and possibly labs 2-3 weeks after starting new medications.  Keyon Winnick M. Vallerie Hentz, M.D.

## 2013-02-15 NOTE — Assessment & Plan Note (Signed)
BP elevated today. Question if this is contributing to some of her symptoms. Will add Lisinopril to her medication regimen. Given warnings of angioedema and to stop medication if she has any symptoms.  Her HEART score is low today, however given her cardiac history, will refer to cardiology for work up for LVH and HTN. If pain continues, return to clinic or the ED for closer evaluation. Continue NSAID for pleurisy type pain. Pt agrees with plan.

## 2013-02-17 ENCOUNTER — Telehealth: Payer: Self-pay | Admitting: Neurology

## 2013-02-17 NOTE — Telephone Encounter (Signed)
Please call, BOTOX injections is for chronic headaches, injected to frontalis, temporalis, occipital region, cervical paraspinal muscles,  please call in verbal order

## 2013-02-17 NOTE — Telephone Encounter (Signed)
[facility] left message that due to insurance needs, the prescription for Botox has to have the injection sites written on the RX.  They asked that doctor issue another prescription and fax it to [phone] or a verbal order can be called to [phone].

## 2013-02-19 NOTE — Telephone Encounter (Signed)
Spoke to [place] at [facility] and relayed injection sites.  She will reopen order.

## 2013-02-26 ENCOUNTER — Telehealth: Payer: Self-pay | Admitting: Neurology

## 2013-03-11 ENCOUNTER — Encounter: Payer: Self-pay | Admitting: *Deleted

## 2013-03-13 ENCOUNTER — Ambulatory Visit: Payer: Medicaid Other | Admitting: Cardiology

## 2013-03-14 ENCOUNTER — Encounter: Payer: Self-pay | Admitting: Cardiology

## 2013-03-14 ENCOUNTER — Ambulatory Visit (INDEPENDENT_AMBULATORY_CARE_PROVIDER_SITE_OTHER): Payer: Medicaid Other | Admitting: Cardiology

## 2013-03-14 VITALS — BP 130/80 | HR 104 | Ht 61.0 in | Wt 257.0 lb

## 2013-03-14 DIAGNOSIS — R079 Chest pain, unspecified: Secondary | ICD-10-CM

## 2013-03-14 LAB — COMPREHENSIVE METABOLIC PANEL
ALT: 24 U/L (ref 0–35)
AST: 21 U/L (ref 0–37)
Albumin: 4 g/dL (ref 3.5–5.2)
Alkaline Phosphatase: 62 U/L (ref 39–117)
BUN: 8 mg/dL (ref 6–23)
CO2: 31 mEq/L (ref 19–32)
Calcium: 9.6 mg/dL (ref 8.4–10.5)
Chloride: 98 mEq/L (ref 96–112)
Creatinine, Ser: 0.7 mg/dL (ref 0.4–1.2)
GFR: 115.17 mL/min (ref >60.00)
Glucose, Bld: 152 mg/dL — ABNORMAL HIGH (ref 70–99)
Potassium: 4.4 mEq/L (ref 3.5–5.1)
Sodium: 137 mEq/L (ref 135–145)
Total Bilirubin: 0.4 mg/dL (ref 0.3–1.2)
Total Protein: 7.4 g/dL (ref 6.0–8.3)

## 2013-03-14 LAB — LIPID PANEL
Cholesterol: 196 mg/dL (ref 0–200)
HDL: 42.7 mg/dL (ref >39.00)
LDL Cholesterol: 120 mg/dL — ABNORMAL HIGH (ref 0–99)
Total CHOL/HDL Ratio: 5
Triglycerides: 167 mg/dL — ABNORMAL HIGH (ref 0.0–149.0)
VLDL: 33.4 mg/dL (ref 0.0–40.0)

## 2013-03-14 LAB — CBC WITH DIFFERENTIAL/PLATELET
Basophils Absolute: 0 10*3/uL (ref 0.0–0.1)
Basophils Relative: 0.6 % (ref 0.0–3.0)
Eosinophils Absolute: 0.1 10*3/uL (ref 0.0–0.7)
Eosinophils Relative: 2.6 % (ref 0.0–5.0)
HCT: 36.7 % (ref 36.0–46.0)
Hemoglobin: 12.4 g/dL (ref 12.0–15.0)
Lymphocytes Relative: 36 % (ref 12.0–46.0)
Lymphs Abs: 1.4 10*3/uL (ref 0.7–4.0)
MCHC: 33.7 g/dL (ref 30.0–36.0)
MCV: 86.3 fl (ref 78.0–100.0)
Monocytes Absolute: 0.4 10*3/uL (ref 0.1–1.0)
Monocytes Relative: 9.1 % (ref 3.0–12.0)
Neutro Abs: 2 10*3/uL (ref 1.4–7.7)
Neutrophils Relative %: 51.7 % (ref 43.0–77.0)
Platelets: 345 10*3/uL (ref 150.0–400.0)
RBC: 4.25 Mil/uL (ref 3.87–5.11)
RDW: 13.3 % (ref 11.5–14.6)
WBC: 3.9 10*3/uL — ABNORMAL LOW (ref 4.5–10.5)

## 2013-03-14 LAB — TSH: TSH: 0.62 u[IU]/mL (ref 0.35–5.50)

## 2013-03-14 MED ORDER — IBUPROFEN 200 MG PO CAPS: ORAL_CAPSULE | ORAL | Status: DC

## 2013-03-14 MED ORDER — METOPROLOL TARTRATE 25 MG PO TABS: 25.0000 mg | ORAL_TABLET | Freq: Two times a day (BID) | ORAL | Status: DC

## 2013-03-14 MED ORDER — COLCHICINE 0.6 MG PO TABS: 0.6000 mg | ORAL_TABLET | Freq: Every day | ORAL | Status: DC

## 2013-03-14 MED ORDER — COLCHICINE 0.6 MG PO TABS: ORAL_TABLET | ORAL | Status: DC

## 2013-03-14 NOTE — Progress Notes (Signed)
Patient ID: Michelle Trevino, female   DOB: 09-17-1970, 42 y.o.   MRN: [id number]    Patient Name: Michelle Trevino Date of Encounter: 03/14/2013  Primary Care Provider:  Janit Pagan, MD Primary Cardiologist:  Michelle Trevino, H   Patient Profile  Chest pain  Problem List   Past Medical History  Diagnosis Date  . Polyuria   . Hemorrhoids   . Migraines   . Anxiety   . Asthma   . Hypertension   . Allergy   . GERD (gastroesophageal reflux disease)   . Heart murmur   . Gestational diabetes    Past Surgical History  Procedure Laterality Date  . Tubal ligation  2000  . Reversal of tubal ligation  2011  . Lipoma resection Left 11/2012    ankle    Allergies  No Known Allergies  HPI  Other pleasant 42 year old female with prior medical history of hypertension who presented to the ER one month ago with acute onset chest pain. She was prescribed diclofenac with some improvement in pain. The pain is sharp in character, gets worse when patient lays down and better when she sits up. It usually lasts for a few minutes but sometimes can last for hours. The patient denies any flulike symptoms prior to starting this chest pain. She also denies any fever. She is complaining of a fatigued when she is trying to attempt any exertion. She feels palpitations with exertion and with any episode of chest pain. No syncope.  Home Medications  Prior to Admission medications   Medication Sig Start Date End Date Taking? Authorizing Provider  AMITIZA 8 MCG capsule take 1 capsule by mouth twice a day with food 10/20/12  Yes Michelle Layman, MD  diclofenac (VOLTAREN) 75 MG EC tablet Take 1 tablet (75 mg total) by mouth 2 (two) times daily. 02/14/13  Yes Michelle Nydia Bouton, MD  gabapentin (NEURONTIN) 300 MG capsule Take 300 mg by mouth at bedtime.   Yes Historical Provider, MD  lisinopril-hydrochlorothiazide (PRINZIDE,ZESTORETIC) 20-25 MG per tablet Take 1 tablet by mouth daily. 02/14/13  Yes  Michelle Nydia Bouton, MD  venlafaxine (EFFEXOR) 75 MG tablet Take 75 mg by mouth at bedtime.   Yes Historical Provider, MD    Family History  Family History  Problem Relation Age of Onset  . Diabetes Mother   . Hypertension Mother   . Osteoarthritis Mother   . Stroke Mother 36  . Hepatitis Mother   . Stomach cancer Father 40  . Stroke Brother 40  . Cancer Maternal Grandmother     unsure what type  . Colon cancer Neg Hx   . Esophageal cancer Neg Hx   . Rectal cancer Neg Hx     Social History  History   Social History  . Marital Status: Single    Spouse Name: N/A    Number of Children: N/A  . Years of Education: N/A   Occupational History  . Student    Social History Main Topics  . Smoking status: Former Smoker -- 0.50 packs/day    Quit date: 06/29/2012  . Smokeless tobacco: Never Used  . Alcohol Use: 2.4 oz/week    4 Glasses of wine per week     Comment: socially  . Drug Use: No  . Sexual Activity: Yes    Birth Control/ Protection: None   Other Topics Concern  . Not on file   Social History Narrative   Lives with same sex partner and each  has children from another relationship.  All are patients at [facility].       Review of Systems General:  No chills, fever, night sweats or weight changes.  Cardiovascular:  + chest pain, + dyspnea on exertion, - edema, - orthopnea, + palpitations, - paroxysmal nocturnal dyspnea on exertion. Dermatological: No rash, lesions/masses Respiratory: No cough, + dyspnea Urologic: No hematuria, dysuria Abdominal:   No nausea, vomiting, diarrhea, bright red blood per rectum, melena, or hematemesis Neurologic:  No visual changes, wkns, changes in mental status. All other systems reviewed and are otherwise negative except as noted above.  Physical Exam  Blood pressure 130/80, pulse 104, height 5\' 1"  (1.549 m), weight 257 lb (116.574 kg), last menstrual period 01/23/2013.  General: Pleasant, NAD Psych: Normal affect. Neuro: Alert and  oriented X 3. Moves all extremities spontaneously. HEENT: Normal  Neck: Supple without bruits or JVD. Lungs:  Resp regular and unlabored, CTA. Heart: RRR no s3, s4, or murmurs, no rub. Abdomen: Soft, non-tender, non-distended, BS + x 4.  Extremities: No clubbing, cyanosis or edema. DP/PT/Radials 2+ and equal bilaterally.  Accessory Clinical Findings  ECG - Sinus tachycardia, 104 BPM, LVH, nonspecific T wave changes  Assessment & Plan  1.  Pericarditis - we will order echocardiogram to assess for pericardial effusion and pericardial thickness. There are currently no symptoms of constrictive pericarditis.  We will prescribe : Ibuprofen 400 TID Colchicine 0.6 mg BID Metoprolol 25 mg bid  Follow up in 3 weeks    Michelle Trevino, Rexene Edison, MD 03/14/2013, 10:40 AM

## 2013-03-14 NOTE — Patient Instructions (Addendum)
Stop Voltaren   Start Colchicine 0.6 mg twice a day  Start Ibuprofen 200 mg take 2 tablets three times a day  Start Metoprolol Tart 25 mg twice a day   Your physician has requested that you have an echocardiogram. Echocardiography is a painless test that uses sound waves to create images of your heart. It provides your doctor with information about the size and shape of your heart and how well your heart's chambers and valves are working. This procedure takes approximately one hour. There are no restrictions for this procedure.    Your physician recommends that you schedule a follow-up appointment in: 3 weeks

## 2013-03-28 ENCOUNTER — Ambulatory Visit (HOSPITAL_COMMUNITY): Payer: Medicaid Other | Attending: Cardiology | Admitting: Radiology

## 2013-03-28 DIAGNOSIS — R072 Precordial pain: Secondary | ICD-10-CM

## 2013-03-28 DIAGNOSIS — R079 Chest pain, unspecified: Secondary | ICD-10-CM | POA: Insufficient documentation

## 2013-03-28 NOTE — Progress Notes (Signed)
Echocardiogram performed.  

## 2013-04-01 ENCOUNTER — Ambulatory Visit (INDEPENDENT_AMBULATORY_CARE_PROVIDER_SITE_OTHER): Payer: Medicaid Other | Admitting: Cardiology

## 2013-04-01 ENCOUNTER — Encounter: Payer: Self-pay | Admitting: Cardiology

## 2013-04-01 VITALS — BP 117/77 | HR 61 | Ht 61.0 in | Wt 260.0 lb

## 2013-04-01 DIAGNOSIS — R079 Chest pain, unspecified: Secondary | ICD-10-CM

## 2013-04-01 MED ORDER — ATORVASTATIN CALCIUM 10 MG PO TABS: 10.0000 mg | ORAL_TABLET | Freq: Every day | ORAL | Status: DC

## 2013-04-01 MED ORDER — NITROGLYCERIN 0.4 MG SL SUBL: 0.4000 mg | SUBLINGUAL_TABLET | SUBLINGUAL | Status: DC | PRN

## 2013-04-01 NOTE — Progress Notes (Signed)
Patient ID: Michelle Trevino, female   DOB: [birth year], 42 y.o.   MRN: [id number]  Patient Name: Michelle Trevino Date of Encounter: 04/01/2013  Primary Care Provider:  Janit Pagan, MD Primary Cardiologist:  Tobias Alexander, H   Patient Profile  Chest pain  Problem List   Past Medical History  Diagnosis Date  . Polyuria   . Hemorrhoids   . Migraines   . Anxiety   . Asthma   . Hypertension   . Allergy   . GERD (gastroesophageal reflux disease)   . Heart murmur   . Gestational diabetes    Past Surgical History  Procedure Laterality Date  . Tubal ligation  2000  . Reversal of tubal ligation  2011  . Lipoma resection Left 11/2012    ankle    Allergies  No Known Allergies  HPI  Other pleasant 42 year old female with prior medical history of hypertension who presented to the ER one month ago with acute onset chest pain. She was prescribed diclofenac with some improvement in pain. The pain is sharp in character, gets worse when patient lays down and better when she sits up. It usually lasts for a few minutes but sometimes can last for hours. The patient denies any flu-like symptoms prior to starting this chest pain. She also denies any fever. She is complaining of a fatigued when she is trying to attempt any exertion. She feels palpitations with exertion and with any episode of chest pain. No syncope.  The patient is coming for followup after 3 weeks. She was prescribed colchicine and ibuprofen at the last clinic visit. She reports no improvement in her chest pain and now states that her pain is worse on exertion. She gets these pain daily.  Home Medications  Prior to Admission medications   Medication Sig Start Date End Date Taking? Authorizing Provider  AMITIZA 8 MCG capsule take 1 capsule by mouth twice a day with food 10/20/12  Yes Mardella Layman, MD  diclofenac (VOLTAREN) 75 MG EC tablet Take 1 tablet (75 mg total) by mouth 2 (two) times daily. 02/14/13  Yes  Amber Nydia Bouton, MD  gabapentin (NEURONTIN) 300 MG capsule Take 300 mg by mouth at bedtime.   Yes Historical Provider, MD  lisinopril-hydrochlorothiazide (PRINZIDE,ZESTORETIC) 20-25 MG per tablet Take 1 tablet by mouth daily. 02/14/13  Yes Amber Nydia Bouton, MD  venlafaxine (EFFEXOR) 75 MG tablet Take 75 mg by mouth at bedtime.   Yes Historical Provider, MD    Family History  Family History  Problem Relation Age of Onset  . Diabetes Mother   . Hypertension Mother   . Osteoarthritis Mother   . Stroke Mother 81  . Hepatitis Mother   . Stomach cancer Father 21  . Stroke Brother 40  . Cancer Maternal Grandmother     unsure what type  . Colon cancer Neg Hx   . Esophageal cancer Neg Hx   . Rectal cancer Neg Hx     Social History  History   Social History  . Marital Status: Single    Spouse Name: N/A    Number of Children: N/A  . Years of Education: N/A   Occupational History  . Student    Social History Main Topics  . Smoking status: Former Smoker -- 0.50 packs/day    Quit date: 06/29/2012  . Smokeless tobacco: Never Used  . Alcohol Use: 2.4 oz/week    4 Glasses of wine per week     Comment:  socially  . Drug Use: No  . Sexual Activity: Yes    Birth Control/ Protection: None   Other Topics Concern  . Not on file   Social History Narrative   Lives with same sex partner and each has children from another relationship.  All are patients at [facility].       Review of Systems General:  No chills, fever, night sweats or weight changes.  Cardiovascular:  + chest pain, + dyspnea on exertion, - edema, - orthopnea, + palpitations, - paroxysmal nocturnal dyspnea on exertion. Dermatological: No rash, lesions/masses Respiratory: No cough, + dyspnea Urologic: No hematuria, dysuria Abdominal:   No nausea, vomiting, diarrhea, bright red blood per rectum, melena, or hematemesis Neurologic:  No visual changes, wkns, changes in mental status. All other systems reviewed and are  otherwise negative except as noted above.  Physical Exam  Blood pressure 117/77, pulse 61, height 5\' 1"  (1.549 m), weight 260 lb (117.935 kg).  General: Pleasant, NAD Psych: Normal affect. Neuro: Alert and oriented X 3. Moves all extremities spontaneously. HEENT: Normal  Neck: Supple without bruits or JVD. Lungs:  Resp regular and unlabored, CTA. Heart: RRR no s3, s4, or murmurs, no rub. Abdomen: Soft, non-tender, non-distended, BS + x 4.  Extremities: No clubbing, cyanosis or edema. DP/PT/Radials 2+ and equal bilaterally.  Accessory Clinical Findings  ECG - Sinus tachycardia, 104 BPM, LVH, nonspecific T wave changes  Lipid Panel     Component Value Date/Time   CHOL 196 03/14/2013 1119   TRIG 167.0* 03/14/2013 1119   HDL 42.70 03/14/2013 1119   CHOLHDL 5 03/14/2013 1119   VLDL 33.4 03/14/2013 1119   LDLCALC 120* 03/14/2013 1119    TTE 03/28/2013  Left ventricle: The cavity size was normal. Wall thickness was increased in a pattern of mild LVH. Systolic function was normal. The estimated ejection fraction was in the range of 60% to 65%. Wall motion was normal; there were no regional wall motion abnormalities. The transmitral flow pattern was normal. The deceleration time of the early transmitral flow velocity was normal. The pulmonary vein flow pattern was normal. The tissue Doppler parameters were normal. Left ventricular diastolic function parameters were normal. ------------------------------------------------------------ Aortic valve: Structurally normal valve. Trileaflet. Cusp separation was normal. Doppler: Transvalvular velocity was within the normal range. There was no stenosis. No regurgitation. ------------------------------------------------------------ Aorta: The aorta was normal, not dilated, and non-diseased. ----------------------------------------------------------- Mitral valve: Structurally normal valve. Leaflet separation was normal. Doppler:  Transvalvular velocity was within the normal range. There was no evidence for stenosis. No regurgitation. Peak gradient: 2mm Hg (D). ------------------------------------------------------------ Left atrium: The atrium was normal in size. ------------------------------------------------------------ Right ventricle: The cavity size was normal. Wall thickness was normal. Systolic function was normal. ------------------------------------------------------------ Pulmonic valve: Structurally normal valve. Cusp separation was normal. Doppler: Transvalvular velocity was within the normal range. No regurgitation. ------------------------------------------------------------ Tricuspid valve: Structurally normal valve. Leaflet separation was normal. Doppler: Transvalvular velocity was within the normal range. Mild regurgitation. ------------------------------------------------------------ Right atrium: The atrium was normal in size. ----------------------------------------------------------- Pericardium: The pericardium was normal in appearance. ----------------------------------------------------------- Systemic veins: Inferior vena cava: The vessel was normal in size; the respirophasic diameter changes were in the normal range (= 50%); findings are consistent with normal central venous pressure. ------------------------------------------------------------ Post procedure conclusions Ascending Aorta: - The aorta was normal, not dilated, and non-diseased.    Assessment & Plan  42 year old female with multiple risk factors for CAD including hypertension, hyperlipidemia, diabetes mellitus  1. Chest pain - no improvement on NSAIDS and  colchine after 3 weeks of therapy. The pain is now exertional. Considering her risk factors we will order a exercise nuclear stress test. Ideally we would order a stress MRI with assessment of possible pericardittis, however due the currect back upon outpatient  MRI orders we will do Myoview instead. D/C ibuprofen and colchicine. Start sl NTG PRN  2. HTN- controlled - mild LVH on echocardiogram, continue Metoprolol 25 mg bid, lisinopril 20 mg daily, hydrochlorothiazide 25 mg daily  3. Hyperlipidemia - high LDL and TAG, considering chest pain and risk factors we will start Lipitor 10 mg po daily  Follow up in 3 weeks.   Tobias Alexander, Rexene Edison, MD 04/01/2013, 11:04 AM

## 2013-04-01 NOTE — Patient Instructions (Addendum)
Your physician has recommended you make the following change in your medication: start taking Lipitor 10 mg at bedtime, take Nitroglycerin 0.4 mg as neede for chest pain, please take as directed at todays visit and on pill bottle , stop taking Ibuprofen and Colchicine.  Your physician has requested that you have en exercise stress myoview. For further information please visit [email or web address] Please follow instruction sheet, as given.  Your physician recommends that you schedule a follow-up appointment in: after stress test

## 2013-04-09 ENCOUNTER — Ambulatory Visit (HOSPITAL_COMMUNITY): Payer: Medicaid Other | Attending: Cardiology | Admitting: Radiology

## 2013-04-09 ENCOUNTER — Ambulatory Visit (INDEPENDENT_AMBULATORY_CARE_PROVIDER_SITE_OTHER): Payer: Medicaid Other

## 2013-04-09 VITALS — BP 123/67 | HR 72 | Ht 61.0 in | Wt 253.0 lb

## 2013-04-09 VITALS — BP 111/74 | HR 114 | Resp 20 | Ht 61.0 in | Wt 254.0 lb

## 2013-04-09 DIAGNOSIS — E669 Obesity, unspecified: Secondary | ICD-10-CM | POA: Insufficient documentation

## 2013-04-09 DIAGNOSIS — R0989 Other specified symptoms and signs involving the circulatory and respiratory systems: Secondary | ICD-10-CM | POA: Insufficient documentation

## 2013-04-09 DIAGNOSIS — R0602 Shortness of breath: Secondary | ICD-10-CM

## 2013-04-09 DIAGNOSIS — R110 Nausea: Secondary | ICD-10-CM | POA: Insufficient documentation

## 2013-04-09 DIAGNOSIS — E785 Hyperlipidemia, unspecified: Secondary | ICD-10-CM | POA: Insufficient documentation

## 2013-04-09 DIAGNOSIS — D1720 Benign lipomatous neoplasm of skin and subcutaneous tissue of unspecified limb: Secondary | ICD-10-CM

## 2013-04-09 DIAGNOSIS — R0609 Other forms of dyspnea: Secondary | ICD-10-CM | POA: Insufficient documentation

## 2013-04-09 DIAGNOSIS — D1779 Benign lipomatous neoplasm of other sites: Secondary | ICD-10-CM

## 2013-04-09 DIAGNOSIS — Z87891 Personal history of nicotine dependence: Secondary | ICD-10-CM | POA: Insufficient documentation

## 2013-04-09 DIAGNOSIS — D492 Neoplasm of unspecified behavior of bone, soft tissue, and skin: Secondary | ICD-10-CM

## 2013-04-09 DIAGNOSIS — R000 Tachycardia, unspecified: Secondary | ICD-10-CM | POA: Insufficient documentation

## 2013-04-09 DIAGNOSIS — R079 Chest pain, unspecified: Secondary | ICD-10-CM

## 2013-04-09 DIAGNOSIS — R0789 Other chest pain: Secondary | ICD-10-CM | POA: Insufficient documentation

## 2013-04-09 DIAGNOSIS — I10 Essential (primary) hypertension: Secondary | ICD-10-CM | POA: Insufficient documentation

## 2013-04-09 DIAGNOSIS — R002 Palpitations: Secondary | ICD-10-CM | POA: Insufficient documentation

## 2013-04-09 MED ORDER — TECHNETIUM TC 99M SESTAMIBI GENERIC - CARDIOLITE
33.0000 | Freq: Once | INTRAVENOUS | Status: AC | PRN
  Administered 2013-04-09: 33 via INTRAVENOUS

## 2013-04-09 NOTE — Progress Notes (Signed)
  Subjective:    Patient ID: Michelle Trevino, female    DOB: Jul 08, 1970, 42 y.o.   MRN: [id number] "It's doing much better.  Now we need to see when he's going to do the other foot."   HPI no changes in medication her health history. The left foot and ankle is doing well slight paresthesia around the incision area since the swelling is going down following lipoma resection of the sinus tarsi area left foot is doing much better patient does have a prediabetic state has some paresthesia to the toes and gabapentin has been helping with that she continues to get once at bedtime.    Review of Systems deferred at this visit     Objective:   Physical Exam Neurovascular status is intact with pedal pulses palpable DP and PT posterior were for. Capillary fill time 3 seconds all digits. Epicritic and proprioceptive sensations intact. 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 Dermatologically skin color pigment normal. There is a nodular soft tissue tumor cyst and sinus tarsi area of the right ankle area is tender on palpation and freely movable from underlying bone and overlying skin. 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 Plan at this time consent form for excision of lipoma soft tissue tumor or cyst right ankle. Consent form was reviewed and signed. All questions asked medication are answered. No contraindications noted. 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 Surgery scheduled at her convenience with appropriate postop followup thereafter  Alvan Dame DP

## 2013-04-09 NOTE — Progress Notes (Signed)
[facility] SITE 3 NUCLEAR MED [address] [place], [place] [address] [phone]    Cardiology Nuclear Med Study  Michelle Trevino is a 42 y.o. female     MRN : [id number]     DOB: 11/15/1970  Procedure Date: 04/09/2013  Nuclear Med Background Indication for Stress Test:  Evaluation for Ischemia History:  10/14 Echo:EF=65% Cardiac Risk Factors: History of Smoking, Hypertension, Lipids and Obesity  Symptoms:  Chest Pressure with and without Exertion (last episode of chest discomfort is now, 2-3/10, it is constant), DOE, Nausea, Palpitations and Rapid HR   Nuclear Pre-Procedure Caffeine/Decaff Intake:  None > 12 hours NPO After: 7:30pm   Lungs:  Clear. O2 Sat: 98% on room air. IV 0.9% NS with Angio Cath:  22g  IV Site: L Antecubital , tolerated well IV Started by:  Irean Hong, RN  Chest Size (in):  38 Cup Size: C  Height: 5\' 1"  (1.549 m)  Weight:  253 lb (114.76 kg)  BMI:  Body mass index is 47.83 kg/(m^2). Tech Comments: Lopressor held x 24 hours    Nuclear Med Study 1 or 2 day study: 2 day  Stress Test Type:  Stress  Reading MD: Tobias Alexander, MD  Order Authorizing Provider:  Tobias Alexander, MD  Resting Radionuclide: Technetium 22m Sestamibi  Resting Radionuclide Dose: 33.0 mCi on 04/16/13   Stress Radionuclide:  Technetium 53m Sestamibi  Stress Radionuclide Dose: 33.0 mCi on 04/09/13           Stress Protocol Rest HR: 72 Stress HR: 171  Rest BP: 123/67 Stress BP: 153/63  Exercise Time (min): 5:30 METS: 6.5   Predicted Max HR: 180 bpm % Max HR: 95 bpm Rate Pressure Product: [id number]   Dose of Adenosine (mg):  n/a Dose of Lexiscan: n/a mg  Dose of Atropine (mg): n/a Dose of Dobutamine: n/a mcg/kg/min (at max HR)  Stress Test Technologist: Smiley Houseman, CMA-N  Nuclear Technologist:  Domenic Polite, CNMT     Rest Procedure:  Myocardial perfusion imaging was performed at rest 45 minutes following the intravenous administration of Technetium 14m  Sestamibi.  Rest ECG: NSR - nonspecific ST changes  Stress Procedure:  The patient exercised on the treadmill utilizing the Bruce Protocol for 5:30 minutes. The patient stopped due to fatigue.  She did c/o chest pressure, 5/10, with exercise.  Technetium 5m Sestamibi was injected at peak exercise and myocardial perfusion imaging was performed after a brief delay.  Stress ECG: No significant change from baseline ECG  QPS Raw Data Images:  Normal; no motion artifact; normal heart/lung ratio. Stress Images:  Normal homogeneous uptake in all areas of the myocardium. Rest Images:  Normal homogeneous uptake in all areas of the myocardium. Subtraction (SDS):  No evidence of ischemia. Transient Ischemic Dilatation (Normal <1.22):  0.96 Lung/Heart Ratio (Normal <0.45):  0.35  Quantitative Gated Spect Images QGS EDV:  93 ml QGS ESV:  42 ml  Impression Exercise Capacity:  Poor exercise capacity. BP Response:  Normal blood pressure response. Clinical Symptoms:  Fatigue ECG Impression:  No significant ST segment change suggestive of ischemia. Comparison with Prior Nuclear Study: No previous nuclear study performed  Overall Impression:  Normal stress nuclear study.  LV Ejection Fraction: 55%.  LV Wall Motion:  NL LV Function; NL Wall Motion  Tobias Alexander, Rexene Edison 04/16/2013

## 2013-04-09 NOTE — Patient Instructions (Signed)
Pre-Operative Instructions  Congratulations, you have decided to take an important step to improving your quality of life.  You can be assured that the doctors of [facility] will be with you every step of the way.  1. Plan to be at the surgery center/hospital at least 1 (one) hour prior to your scheduled time unless otherwise directed by the surgical center/hospital staff.  You must have a responsible adult accompany you, remain during the surgery and drive you home.  Make sure you have directions to the surgical center/hospital and know how to get there on time. 2. For hospital based surgery you will need to obtain a history and physical form from your family physician within 1 month prior to the date of surgery- we will give you a form for you primary physician.  3. We make every effort to accommodate the date you request for surgery.  There are however, times where surgery dates or times have to be moved.  We will contact you as soon as possible if a change in schedule is required.   4. No Aspirin/Ibuprofen for one week before surgery.  If you are on aspirin, any non-steroidal anti-inflammatory medications (Mobic, Aleve, Ibuprofen) you should stop taking it 7 days prior to your surgery.  You make take Tylenol  For pain prior to surgery.  5. Medications- If you are taking daily heart and blood pressure medications, seizure, reflux, allergy, asthma, anxiety, pain or diabetes medications, make sure the surgery center/hospital is aware before the day of surgery so they may notify you which medications to take or avoid the day of surgery. 6. No food or drink after midnight the night before surgery unless directed otherwise by surgical center/hospital staff. 7. No alcoholic beverages 24 hours prior to surgery.  No smoking 24 hours prior to or 24 hours after surgery. 8. Wear loose pants or shorts- loose enough to fit over bandages, boots, and casts. 9. No slip on shoes, sneakers are best. 10. Bring  your boot with you to the surgery center/hospital.  Also bring crutches or a walker if your physician has prescribed it for you.  If you do not have this equipment, it will be provided for you after surgery. 11. If you have not been contracted by the surgery center/hospital by the day before your surgery, call to confirm the date and time of your surgery. 12. Leave-time from work may vary depending on the type of surgery you have.  Appropriate arrangements should be made prior to surgery with your employer. 13. Prescriptions will be provided immediately following surgery by your doctor.  Have these filled as soon as possible after surgery and take the medication as directed. 14. Remove nail polish on the operative foot. 15. Wash the night before surgery.  The night before surgery wash the foot and leg well with the antibacterial soap provided and water paying special attention to beneath the toenails and in between the toes.  Rinse thoroughly with water and dry well with a towel.  Perform this wash unless told not to do so by your physician.  Enclosed: 1 Ice pack (please put in freezer the night before surgery)   1 Hibiclens skin cleaner   Pre-op Instructions  If you have any questions regarding the instructions, do not hesitate to call our office.  [place]: [address] [place], [place] [address] [phone]  [place]: [address] [place], [place] [address] [phone]  [place]: [address]  [place], [place] [address] [phone]  Dr. Melesa Lecy   Tuchman DPM, Dr. Norman Regal DPM Dr. Katilyn Miltenberger DPM, Dr. M. Todd Hyatt DPM, Dr. Kathryn Egerton DPM 

## 2013-04-10 ENCOUNTER — Other Ambulatory Visit: Payer: Self-pay

## 2013-04-15 ENCOUNTER — Encounter (HOSPITAL_COMMUNITY): Payer: Medicaid Other

## 2013-04-16 ENCOUNTER — Encounter: Payer: Self-pay | Admitting: Cardiology

## 2013-04-16 ENCOUNTER — Ambulatory Visit (HOSPITAL_COMMUNITY): Payer: Medicaid Other | Attending: Cardiology

## 2013-04-16 DIAGNOSIS — R0989 Other specified symptoms and signs involving the circulatory and respiratory systems: Secondary | ICD-10-CM

## 2013-04-16 MED ORDER — TECHNETIUM TC 99M SESTAMIBI GENERIC - CARDIOLITE
33.0000 | Freq: Once | INTRAVENOUS | Status: AC | PRN
  Administered 2013-04-16: 33 via INTRAVENOUS

## 2013-04-17 ENCOUNTER — Ambulatory Visit (INDEPENDENT_AMBULATORY_CARE_PROVIDER_SITE_OTHER): Payer: Medicaid Other | Admitting: Cardiology

## 2013-04-17 ENCOUNTER — Encounter: Payer: Self-pay | Admitting: Cardiology

## 2013-04-17 VITALS — BP 130/85 | HR 78 | Ht 61.0 in | Wt 257.0 lb

## 2013-04-17 DIAGNOSIS — I10 Essential (primary) hypertension: Secondary | ICD-10-CM

## 2013-04-17 DIAGNOSIS — E785 Hyperlipidemia, unspecified: Secondary | ICD-10-CM

## 2013-04-17 DIAGNOSIS — R011 Cardiac murmur, unspecified: Secondary | ICD-10-CM

## 2013-04-17 NOTE — Progress Notes (Signed)
Patient ID: Michelle Trevino, female   DOB: July 01, 1970, 42 y.o.   MRN: [id number]  Patient Name: Michelle Trevino Date of Encounter: 04/17/2013  Primary Care Provider:  Janit Pagan, MD Primary Cardiologist:  Tobias Alexander, H  Patient Profile  Chest pain  Problem List   Past Medical History  Diagnosis Date  . Polyuria   . Hemorrhoids   . Migraines   . Anxiety   . Asthma   . Hypertension   . Allergy   . GERD (gastroesophageal reflux disease)   . Heart murmur   . Gestational diabetes    Past Surgical History  Procedure Laterality Date  . Tubal ligation  2000  . Reversal of tubal ligation  2011  . Lipoma resection Left 11/2012    ankle    Allergies  No Known Allergies  HPI  Other pleasant 42 year old female with prior medical history of hypertension who presented to the ER one month ago with acute onset chest pain. She was prescribed diclofenac with some improvement in pain. The pain is sharp in character, gets worse when patient lays down and better when she sits up. It usually lasts for a few minutes but sometimes can last for hours. The patient denies any flu-like symptoms prior to starting this chest pain. She also denies any fever. She is complaining of a fatigued when she is trying to attempt any exertion. She feels palpitations with exertion and with any episode of chest pain. No syncope.  The patient is coming for followup after 3 weeks. She was prescribed colchicine and ibuprofen at the last clinic visit. She reports no improvement in her chest pain and now states that her pain is worse on exertion. She gets these pain daily.  Home Medications  Prior to Admission medications   Medication Sig Start Date End Date Taking? Authorizing Provider  AMITIZA 8 MCG capsule take 1 capsule by mouth twice a day with food 10/20/12  Yes Mardella Layman, MD  diclofenac (VOLTAREN) 75 MG EC tablet Take 1 tablet (75 mg total) by mouth 2 (two) times daily. 02/14/13  Yes Amber Nydia Bouton, MD  gabapentin (NEURONTIN) 300 MG capsule Take 300 mg by mouth at bedtime.   Yes Historical Provider, MD  lisinopril-hydrochlorothiazide (PRINZIDE,ZESTORETIC) 20-25 MG per tablet Take 1 tablet by mouth daily. 02/14/13  Yes Amber Nydia Bouton, MD  venlafaxine (EFFEXOR) 75 MG tablet Take 75 mg by mouth at bedtime.   Yes Historical Provider, MD    Family History  Family History  Problem Relation Age of Onset  . Diabetes Mother   . Hypertension Mother   . Osteoarthritis Mother   . Stroke Mother 40  . Hepatitis Mother   . Stomach cancer Father 71  . Stroke Brother 40  . Cancer Maternal Grandmother     unsure what type  . Colon cancer Neg Hx   . Esophageal cancer Neg Hx   . Rectal cancer Neg Hx     Social History  History   Social History  . Marital Status: Single    Spouse Name: N/A    Number of Children: N/A  . Years of Education: N/A   Occupational History  . Student    Social History Main Topics  . Smoking status: Former Smoker -- 0.50 packs/day    Quit date: 06/29/2012  . Smokeless tobacco: Never Used  . Alcohol Use: 0.6 oz/week    1 Glasses of wine per week     Comment: once every 6  mos.  . Drug Use: No  . Sexual Activity: Yes    Birth Control/ Protection: None   Other Topics Concern  . Not on file   Social History Narrative   Lives with same sex partner and each has children from another relationship.  All are patients at [facility].       Review of Systems General:  No chills, fever, night sweats or weight changes.  Cardiovascular:  + chest pain, + dyspnea on exertion, - edema, - orthopnea, + palpitations, - paroxysmal nocturnal dyspnea on exertion. Dermatological: No rash, lesions/masses Respiratory: No cough, + dyspnea Urologic: No hematuria, dysuria Abdominal:   No nausea, vomiting, diarrhea, bright red blood per rectum, melena, or hematemesis Neurologic:  No visual changes, wkns, changes in mental status. All other systems reviewed and are  otherwise negative except as noted above.  Physical Exam  Blood pressure 130/85, pulse 78, height 5\' 1"  (1.549 m), weight 257 lb (116.574 kg), last menstrual period 03/31/2013.  General: Pleasant, NAD Psych: Normal affect. Neuro: Alert and oriented X 3. Moves all extremities spontaneously. HEENT: Normal  Neck: Supple without bruits or JVD. Lungs:  Resp regular and unlabored, CTA. Heart: RRR no s3, s4, or murmurs, no rub. Abdomen: Soft, non-tender, non-distended, BS + x 4.  Extremities: No clubbing, cyanosis or edema. DP/PT/Radials 2+ and equal bilaterally.  Accessory Clinical Findings  ECG - Sinus tachycardia, 104 BPM, LVH, nonspecific T wave changes  Lipid Panel     Component Value Date/Time   CHOL 196 03/14/2013 1119   TRIG 167.0* 03/14/2013 1119   HDL 42.70 03/14/2013 1119   CHOLHDL 5 03/14/2013 1119   VLDL 33.4 03/14/2013 1119   LDLCALC 120* 03/14/2013 1119    TTE 03/28/2013  Left ventricle: The cavity size was normal. Wall thickness was increased in a pattern of mild LVH. Systolic function was normal. The estimated ejection fraction was in the range of 60% to 65%. Wall motion was normal; there were no regional wall motion abnormalities. The transmitral flow pattern was normal. The deceleration time of the early transmitral flow velocity was normal. The pulmonary vein flow pattern was normal. The tissue Doppler parameters were normal. Left ventricular diastolic function parameters were normal. ------------------------------------------------------------ Aortic valve: Structurally normal valve. Trileaflet. Cusp separation was normal. Doppler: Transvalvular velocity was within the normal range. There was no stenosis. No regurgitation. ------------------------------------------------------------ Aorta: The aorta was normal, not dilated, and non-diseased. ----------------------------------------------------------- Mitral valve: Structurally normal valve.  Leaflet separation was normal. Doppler: Transvalvular velocity was within the normal range. There was no evidence for stenosis. No regurgitation. Peak gradient: 2mm Hg (D). ------------------------------------------------------------ Left atrium: The atrium was normal in size. ------------------------------------------------------------ Right ventricle: The cavity size was normal. Wall thickness was normal. Systolic function was normal. ------------------------------------------------------------ Pulmonic valve: Structurally normal valve. Cusp separation was normal. Doppler: Transvalvular velocity was within the normal range. No regurgitation. ------------------------------------------------------------ Tricuspid valve: Structurally normal valve. Leaflet separation was normal. Doppler: Transvalvular velocity was within the normal range. Mild regurgitation. ------------------------------------------------------------ Right atrium: The atrium was normal in size. ----------------------------------------------------------- Pericardium: The pericardium was normal in appearance. ----------------------------------------------------------- Systemic veins: Inferior vena cava: The vessel was normal in size; the respirophasic diameter changes were in the normal range (= 50%); findings are consistent with normal central venous pressure. ------------------------------------------------------------ Post procedure conclusions Ascending Aorta: - The aorta was normal, not dilated, and non-diseased.   Exercise MIBI 04/16/13  Quantitative Gated Spect Images  QGS EDV: 93 ml  QGS ESV: 42 ml  Impression  Exercise Capacity: Poor  exercise capacity.  BP Response: Normal blood pressure response.  Clinical Symptoms: Fatigue  ECG Impression: No significant ST segment change suggestive of ischemia.  Comparison with Prior Nuclear Study: No previous nuclear study performed  Overall Impression: Normal  stress nuclear study.  LV Ejection Fraction: 55%. LV Wall Motion: NL LV Function; NL Wall Motion   Assessment & Plan  42 year old female with multiple risk factors for CAD including hypertension, hyperlipidemia, diabetes mellitus  1. Chest pain - no improvement on NSAIDS and colchine after 3 weeks of therapy. Exercise Myoview showed normal LV function, no perfusion defects but poor functional capacity. The pain is most probably musculoskeletal, we will continue Ibuprofen.  2. HTN- controlled - mild LVH on echocardiogram, continue Metoprolol 25 mg bid, lisinopril 20 mg daily, hydrochlorothiazide 25 mg daily  3. Hyperlipidemia - high LDL and TAG, considering chest pain and risk factors we will start Lipitor 10 mg po daily. The patient is encouraged to start exercising with a goal of weight loss and decreasing cholesterol levels. We will recheck in 3 months with liver enzymes and adjust.  Follow up in 3 months.   Tobias Alexander, Rexene Edison, MD 04/17/2013, 9:52 AM

## 2013-04-17 NOTE — Patient Instructions (Signed)
Your physician recommends that you schedule a follow-up appointment in: 3 MONTHS WITH DR. Delton See TO TALK ABOUT RESULTS  Your physician recommends that you return for lab work in:  LIPID, CMP IN 3 MONTHS   Your physician recommends that you continue on your current medications as directed. Please refer to the Current Medication list given to you today.

## 2013-04-21 DIAGNOSIS — D492 Neoplasm of unspecified behavior of bone, soft tissue, and skin: Secondary | ICD-10-CM

## 2013-04-29 ENCOUNTER — Ambulatory Visit (INDEPENDENT_AMBULATORY_CARE_PROVIDER_SITE_OTHER): Payer: Medicaid Other

## 2013-04-29 VITALS — BP 128/66 | HR 66 | Temp 99.7°F | Resp 12

## 2013-04-29 DIAGNOSIS — D1720 Benign lipomatous neoplasm of skin and subcutaneous tissue of unspecified limb: Secondary | ICD-10-CM

## 2013-04-29 DIAGNOSIS — Z09 Encounter for follow-up examination after completed treatment for conditions other than malignant neoplasm: Secondary | ICD-10-CM

## 2013-04-29 DIAGNOSIS — D1779 Benign lipomatous neoplasm of other sites: Secondary | ICD-10-CM

## 2013-04-29 MED ORDER — TRAMADOL HCL 50 MG PO TABS: 50.0000 mg | ORAL_TABLET | Freq: Four times a day (QID) | ORAL | Status: DC | PRN

## 2013-04-29 NOTE — Patient Instructions (Signed)

## 2013-04-29 NOTE — Progress Notes (Signed)
  Subjective:    Patient ID: Michelle Trevino, female    DOB: Oct 20, 1970, 42 y.o.   MRN: [id number]  HPI Comments: '' THE RT FOOT ANKLE IS A LITTLE SORE''     Review of Systems no changes     Objective:   Physical Exam Neurovascular status is intact. Patient is status post excision of soft tissue mass suspect lipoma right lateral ankle incision clean dry well coapted dressings intact and dry. Ambulating in air fracture boot as instructed. Mild edema and ecchymosis consistent with postop course. Patient plenty of some mild nausea with pain medications we'll switch to tramadol as alternative at this point.       Assessment & Plan:  Assessment good postop progress incision well coapted dry sterile compressive dressing reapplied to the right ankle maintain air fracture boot reappointed in one week plan for suture removal at that time. Maintain moderate weightbearing keep foot elevated use ice daily. Prescription for tramadol 50 mg every 6 hours when necessary pain reappointed one week for suture removal and postop followup next  Alvan Dame DPM

## 2013-05-06 ENCOUNTER — Ambulatory Visit (INDEPENDENT_AMBULATORY_CARE_PROVIDER_SITE_OTHER): Payer: Medicaid Other

## 2013-05-06 VITALS — BP 114/72 | HR 100 | Resp 12

## 2013-05-06 DIAGNOSIS — Z09 Encounter for follow-up examination after completed treatment for conditions other than malignant neoplasm: Secondary | ICD-10-CM

## 2013-05-06 DIAGNOSIS — D1720 Benign lipomatous neoplasm of skin and subcutaneous tissue of unspecified limb: Secondary | ICD-10-CM

## 2013-05-06 DIAGNOSIS — D1779 Benign lipomatous neoplasm of other sites: Secondary | ICD-10-CM

## 2013-05-06 NOTE — Progress Notes (Signed)
   Subjective:    Patient ID: Michelle Trevino, female    DOB: 23-Apr-1971, 42 y.o.   MRN: [id number]  HPI Comments: ''THE RT FOOT HAVE BURNING SENSATION''     Review of Systems no changes or new finding     Objective:   Physical Exam Neurovascular status is intact pedal pulses palpable DP postal for PT one over 4 bilateral. +1 edema noted. The incision well coapted sutures are removed at this time. Silvadene and a gauze dressing is applied followed by an Ace wrap. Maintain Ace wrap or anklet for compression to keep down and edema. May discontinue air fracture boot and return to come for walking shoe or crocs or sandals as tolerated. Maintain compression and moderate walking activities suggested Mederma or cocoa butter to the incision areas daily. Keep compression place no open wounds or dehiscence noted at this time. Patient does have some slight complaint of paresthesia along the incision and distal to the incision area consistent with postop course.       Assessment & Plan:  Good postop progress noted wound is well coapted maintain compression a light Ace wrap reappointed in 4 weeks for long-term postop followup maintain cocoa butter or middermal topical lotion application to the incision area avoid any ballistic activities keep elevated and ice whenever possible.  Alvan Dame DPM

## 2013-05-06 NOTE — Patient Instructions (Signed)

## 2013-05-13 ENCOUNTER — Telehealth: Payer: Self-pay | Admitting: *Deleted

## 2013-05-13 MED ORDER — GABAPENTIN 300 MG PO CAPS: 300.0000 mg | ORAL_CAPSULE | Freq: Every day | ORAL | Status: DC

## 2013-05-13 NOTE — Telephone Encounter (Signed)
Faxed refill request for Gabapentin 300mg  #30 one capsule po q hs.  I refilled with 2 additional.

## 2013-06-10 NOTE — Progress Notes (Signed)
1) Excision of lipoma right ankle (sinus tarsi area)

## 2013-06-16 NOTE — Progress Notes (Signed)
1) Excision lipoma right ankle

## 2013-06-20 ENCOUNTER — Ambulatory Visit (INDEPENDENT_AMBULATORY_CARE_PROVIDER_SITE_OTHER): Payer: Medicaid Other

## 2013-06-20 DIAGNOSIS — M792 Neuralgia and neuritis, unspecified: Secondary | ICD-10-CM

## 2013-06-20 DIAGNOSIS — D1720 Benign lipomatous neoplasm of skin and subcutaneous tissue of unspecified limb: Secondary | ICD-10-CM

## 2013-06-20 DIAGNOSIS — D492 Neoplasm of unspecified behavior of bone, soft tissue, and skin: Secondary | ICD-10-CM

## 2013-06-20 DIAGNOSIS — Z09 Encounter for follow-up examination after completed treatment for conditions other than malignant neoplasm: Secondary | ICD-10-CM

## 2013-06-20 DIAGNOSIS — D1779 Benign lipomatous neoplasm of other sites: Secondary | ICD-10-CM

## 2013-06-20 DIAGNOSIS — IMO0002 Reserved for concepts with insufficient information to code with codable children: Secondary | ICD-10-CM

## 2013-06-20 NOTE — Patient Instructions (Signed)

## 2013-06-20 NOTE — Progress Notes (Signed)
   Subjective:    Patient ID: Michelle Trevino, female    DOB: [birth year], 43 y.o.   MRN: [id number] Pt presents for POV3 right foot removal of a lipoma.  Pt continues to have complain to tingling the affects her whole body. HPI    Review of Systems deferred at this visit no new findings     Objective:   Physical Exam Neurovascular status is intact with pedal pulses palpable DP +2/4 PT plus one over 4 bilateral. Patient continues to have some paresthesias at times even his tingling are seen over her body this may be a prediabetic associated symptomology. Patient indicates that still having some tingling on both incision areas the left lateral ankle as well as the right lateral ankle osseous surgery on left foot in June fracture healed well scar is healed nicely the right incision scar is healing well at one suture tack removed at this time. Still some slight paresthesia around the incisions however when it comes paresthesia affecting her my advice you may want followup with her primary or neurologist in the future. This may be associated with her prediabetic status. The wound appears to be well coapted no discharge or drainage no ascending cellulitis lymphangitis.       Assessment & Plan:  Good postop progress following excision of lipoma bilateral ankles more recently right lateral ankle done in November is well coapted this time suture tack removed is identified and patient will maintain vitamin E cream or ointment to the incision areas recheck in 3 months for long-term postop followup next  Harriet Masson DPM

## 2013-06-26 ENCOUNTER — Telehealth: Payer: Self-pay | Admitting: *Deleted

## 2013-06-26 NOTE — Telephone Encounter (Signed)
Diane at Dr. Rhea Belton office [facility]) called to request NPI number for patient.  Pt has appt for follow-up on 07/01/2013 at 9AM.  NPI given. Derl Barrow, RN

## 2013-07-01 ENCOUNTER — Ambulatory Visit (INDEPENDENT_AMBULATORY_CARE_PROVIDER_SITE_OTHER): Payer: Medicaid Other | Admitting: Neurology

## 2013-07-01 ENCOUNTER — Encounter: Payer: Self-pay | Admitting: Family Medicine

## 2013-07-01 ENCOUNTER — Ambulatory Visit (INDEPENDENT_AMBULATORY_CARE_PROVIDER_SITE_OTHER): Payer: Medicaid Other | Admitting: Family Medicine

## 2013-07-01 ENCOUNTER — Encounter: Payer: Self-pay | Admitting: Neurology

## 2013-07-01 VITALS — BP 134/89 | HR 70 | Ht 61.0 in | Wt 262.0 lb

## 2013-07-01 VITALS — BP 130/64 | HR 74 | Temp 98.3°F | Ht 61.0 in | Wt 261.5 lb

## 2013-07-01 DIAGNOSIS — Z23 Encounter for immunization: Secondary | ICD-10-CM

## 2013-07-01 DIAGNOSIS — E785 Hyperlipidemia, unspecified: Secondary | ICD-10-CM

## 2013-07-01 DIAGNOSIS — R202 Paresthesia of skin: Secondary | ICD-10-CM

## 2013-07-01 DIAGNOSIS — R110 Nausea: Secondary | ICD-10-CM | POA: Insufficient documentation

## 2013-07-01 DIAGNOSIS — R200 Anesthesia of skin: Secondary | ICD-10-CM

## 2013-07-01 DIAGNOSIS — R209 Unspecified disturbances of skin sensation: Secondary | ICD-10-CM

## 2013-07-01 DIAGNOSIS — Z9989 Dependence on other enabling machines and devices: Secondary | ICD-10-CM

## 2013-07-01 DIAGNOSIS — G43909 Migraine, unspecified, not intractable, without status migrainosus: Secondary | ICD-10-CM

## 2013-07-01 DIAGNOSIS — I10 Essential (primary) hypertension: Secondary | ICD-10-CM

## 2013-07-01 DIAGNOSIS — G4730 Sleep apnea, unspecified: Secondary | ICD-10-CM

## 2013-07-01 DIAGNOSIS — G4733 Obstructive sleep apnea (adult) (pediatric): Secondary | ICD-10-CM | POA: Insufficient documentation

## 2013-07-01 MED ORDER — PROMETHAZINE HCL 25 MG PO TABS: 25.0000 mg | ORAL_TABLET | Freq: Three times a day (TID) | ORAL | Status: DC | PRN

## 2013-07-01 MED ORDER — NORTRIPTYLINE HCL 25 MG PO CAPS: ORAL_CAPSULE | ORAL | Status: DC

## 2013-07-01 MED ORDER — GABAPENTIN 100 MG PO CAPS: 300.0000 mg | ORAL_CAPSULE | Freq: Three times a day (TID) | ORAL | Status: DC

## 2013-07-01 MED ORDER — ZOLMITRIPTAN 5 MG NA SOLN: 1.0000 | NASAL | Status: DC | PRN

## 2013-07-01 MED ORDER — PANTOPRAZOLE SODIUM 40 MG PO TBEC: 40.0000 mg | DELAYED_RELEASE_TABLET | Freq: Every day | ORAL | Status: DC

## 2013-07-01 NOTE — Assessment & Plan Note (Signed)
Stable on Zestoretic. Continue current dose.

## 2013-07-01 NOTE — Progress Notes (Signed)
[facility]  PATIENT: Michelle Trevino DOB: 1971/06/01  HISTORICAL  History of Present Illness  HPI: [name] is a 43 year old right-handed African American female, referred by her primary care physician for evaluation of headaches  She has past medical history of headaches for 6 years, typical migraine is bilateral retro-orbital severe pounding headaches, with associated light noise sensitivity, worsening by movement, improved by sleeping dark quiet room, she has couple typical migraine headaches a month, there is no clear triggering event,  In addition she complains of almost daily bilateral temporal area mild pressure headaches, over the years, she has tried different medications, including , Topamax, Zomig, Imitrex without helping her headaches   She is tolerating nortriptyline, no significant side effects, she has mild improvement of her headaches, she continued to have daily mild bilateral frontal headache, takes Tylenol occasionally, one to 2 times a week, she would have much more severe pounding headaches, her typical migraine headaches, if she catches it early on, Maxalt has been helpful, if she was not able to abort her migraine early, she may develop more prolonged severe headaches, her headache has to run through its course, lasting one to 2 hours, sleep usually helps.  UPDATE Jan 27th 2015:  Last BOTOX injection was in August, 2014, which has been very helpful.  She also stopped her nortriptyline, She began to noticed increased migraine since Jan 2015, she has migraine 3-4 times a week, she has not taken Maxalt for a while, it has made her sleepy.  Triggers for her migraines are tension, lights, strain of her eyes.   She had bilateral lateral ankle surgery for lipoma, in June 2014 at left ankle, right ankle Nov 2014. She had numbness tingling at bilateral lateral foot.  She was given neurontin 349m qday, it has been helpful.   REVIEW OF SYSTEMS: Full 14 system  review of systems performed and notable only for fatigue, murmur, blurred vision, cough, snoring, increased thirst, headache, numbness, snoring, anxiety  ALLERGIES: No Known Allergies  HOME MEDICATIONS: Outpatient Prescriptions Prior to Visit  Medication Sig Dispense Refill  . atorvastatin (LIPITOR) 10 MG tablet Take 1 tablet (10 mg total) by mouth daily.  30 tablet  3  . gabapentin (NEURONTIN) 300 MG capsule Take 1 capsule (300 mg total) by mouth at bedtime.  30 capsule  2  . lisinopril-hydrochlorothiazide (PRINZIDE,ZESTORETIC) 20-25 MG per tablet Take 1 tablet by mouth daily.  30 tablet  3  . nitroGLYCERIN (NITROSTAT) 0.4 MG SL tablet Place 1 tablet (0.4 mg total) under the tongue every 5 (five) minutes as needed for chest pain.  25 tablet  3  . traMADol (ULTRAM) 50 MG tablet Take 1 tablet (50 mg total) by mouth every 6 (six) hours as needed.  90 tablet  0  . metoprolol tartrate (LOPRESSOR) 25 MG tablet Take 1 tablet (25 mg total) by mouth 2 (two) times daily.  60 tablet  6  . venlafaxine (EFFEXOR) 75 MG tablet Take 75 mg by mouth at bedtime.       No facility-administered medications prior to visit.    PAST MEDICAL HISTORY: Past Medical History  Diagnosis Date  . Polyuria   . Hemorrhoids   . Migraines   . Anxiety   . Asthma   . Hypertension   . Allergy   . GERD (gastroesophageal reflux disease)   . Heart murmur   . Gestational diabetes     PAST SURGICAL HISTORY: Past Surgical History  Procedure Laterality Date  .  Tubal ligation  2000  . Reversal of tubal ligation  2011  . Lipoma resection Left 11/2012    ankle    FAMILY HISTORY: Family History  Problem Relation Age of Onset  . Diabetes Mother   . Hypertension Mother   . Osteoarthritis Mother   . Stroke Mother 11  . Hepatitis Mother   . Stomach cancer Father 91  . Stroke Brother 40  . Cancer Maternal Grandmother     unsure what type  . Colon cancer Neg Hx   . Esophageal cancer Neg Hx   . Rectal cancer Neg Hx      SOCIAL HISTORY:  History   Social History  . Marital Status: Single    Spouse Name: N/A    Number of Children: N/A  . Years of Education: N/A   Occupational History  . Student    Social History Main Topics  . Smoking status: Former Smoker -- 0.50 packs/day    Quit date: 06/29/2012  . Smokeless tobacco: Never Used  . Alcohol Use: 0.6 oz/week    1 Glasses of wine per week     Comment: once every 6 mos.  . Drug Use: No  . Sexual Activity: Yes    Birth Control/ Protection: None   Other Topics Concern  . Not on file   Social History Narrative   Lives with same sex partner and each has children from another relationship.  All are patients at [facility].       PHYSICAL EXAM   Filed Vitals:   07/01/13 0901  BP: 134/89  Pulse: 70  Height: '5\' 1"'  (1.549 m)  Weight: 262 lb (118.842 kg)    Not recorded    Body mass index is 49.53 kg/(m^2).   Generalized: In no acute distress  Neck: Supple, no carotid bruits   Cardiac: Regular rate rhythm  Pulmonary: Clear to auscultation bilaterally  Musculoskeletal: No deformity  Neurological examination  Mentation: Alert oriented to time, place, history taking, and causual conversation  Cranial nerve II-XII: Pupils were equal round reactive to light extraocular movements were full, Visual field were full on confrontational test. Bilateral fundi were sharp.  Facial sensation and strength were normal. Hearing was intact to finger rubbing bilaterally. Uvula tongue midline.  head turning and shoulder shrug and were normal and symmetric.Tongue protrusion into cheek strength was normal.  Motor: normal tone, bulk and strength. Well-healed bilateral lateral malleolus scar   Sensory: Intact to fine touch, pinprick, preserved vibratory sensation, and proprioception at toes, with exception of hypersensitivity at bilateral lateral dorsal foot  Coordination: Normal finger to nose, heel-to-shin bilaterally there was no truncal  ataxia  Gait: Rising up from seated position without assistance, normal stance, without trunk ataxia, moderate stride, good arm swing, smooth turning, able to perform tiptoe, and heel walking without difficulty.   Romberg signs: Negative  Deep tendon reflexes: Brachioradialis 2/2, biceps 2/2, triceps 2/2, patellar 2/2, Achilles 2/2, plantar responses were flexor bilaterally.   DIAGNOSTIC DATA (LABS, IMAGING, TESTING) - I reviewed patient records, labs, notes, testing and imaging myself where available.  Lab Results  Component Value Date   WBC 3.9* 03/14/2013   HGB 12.4 03/14/2013   HCT 36.7 03/14/2013   MCV 86.3 03/14/2013   PLT 345.0 03/14/2013      Component Value Date/Time   NA 137 03/14/2013 1119   K 4.4 03/14/2013 1119   CL 98 03/14/2013 1119   CO2 31 03/14/2013 1119   GLUCOSE 152* 03/14/2013 1119   BUN  8 03/14/2013 1119   CREATININE 0.7 03/14/2013 1119   CREATININE 0.74 09/20/2011 0901   CALCIUM 9.6 03/14/2013 1119   PROT 7.4 03/14/2013 1119   ALBUMIN 4.0 03/14/2013 1119   AST 21 03/14/2013 1119   ALT 24 03/14/2013 1119   ALKPHOS 62 03/14/2013 1119   BILITOT 0.4 03/14/2013 1119   GFRNONAA >60 03/10/2010 2310   GFRAA  Value: >60        The eGFR has been calculated using the MDRD equation. This calculation has not been validated in all clinical situations. eGFR's persistently <60 mL/min signify possible Chronic Kidney Disease. 03/10/2010 2310   Lab Results  Component Value Date   CHOL 196 03/14/2013   HDL 42.70 03/14/2013   LDLCALC 120* 03/14/2013   TRIG 167.0* 03/14/2013   CHOLHDL 5 03/14/2013   Lab Results  Component Value Date   HGBA1C 6.0 02/14/2013   Lab Results  Component Value Date   VITAMINB12 387 03/12/2012   Lab Results  Component Value Date   TSH 0.62 03/14/2013      ASSESSMENT AND PLAN   43 years old [place] American female, with history of migraine, now with frequent flareup after stopping preventive medications including nortriptyline,  scheduled Botox injection, she also complains of anxiety, bilateral foot neuropathic pain , after bilateral ankle lipoma surgery.  1. I will start her on nortriptyline, titrating to 25 mg 2 tablets every night at headache prevention, also serve as antianxiety medications  2 Zomig nasal spray as needed for migraine abortive treatment she complains of drowsiness with Imitrex, Maxalt use, . 3 increase Neurontin to 300 mg 3 times a day for neuropathic pain  4 compounding cream 5 return to clinic with Hoyle Sauer in 3 months.       Marcial Pacas, M.D. Ph.D.  [facility] [address] [place], [place] [address] [phone]

## 2013-07-01 NOTE — Progress Notes (Signed)
Subjective:     Patient ID: Michelle Trevino, female   DOB: [birth year], 43 y.o.   MRN: [id number]  HPI Nausea;C/O recurrent nausea for 8yr,no vomited, no change in her bowel habit,denies abdominal pain,no blood in her stool,no change in diet. Not related to food. She has hx of reflux in the past for which she used prilosec with minimal improvement. HTN/HLD:She is compliant with all her medications,here for follow up. Sleepapnea: C/O excessive snoring and choking in her sleep at night, she stated her partner noticed multiple episodes when she would stop breathing for few min and then wakes from her sleep. She also mentioned during her last surgery in nov she had difficulty with intubation and was informed by her anesthesiologist she might have sleep apnea and she would be needing sleep study,she is here for sleep study referral.  Current Outpatient Prescriptions on File Prior to Visit  Medication Sig Dispense Refill  . atorvastatin (LIPITOR) 10 MG tablet Take 1 tablet (10 mg total) by mouth daily.  30 tablet  3  . lisinopril-hydrochlorothiazide (PRINZIDE,ZESTORETIC) 20-25 MG per tablet Take 1 tablet by mouth daily.  30 tablet  3  . nitroGLYCERIN (NITROSTAT) 0.4 MG SL tablet Place 1 tablet (0.4 mg total) under the tongue every 5 (five) minutes as needed for chest pain.  25 tablet  3  . traMADol (ULTRAM) 50 MG tablet Take 1 tablet (50 mg total) by mouth every 6 (six) hours as needed.  90 tablet  0   No current facility-administered medications on file prior to visit.   Past Medical History  Diagnosis Date  . Polyuria   . Hemorrhoids   . Migraines   . Anxiety   . Asthma   . Hypertension   . Allergy   . GERD (gastroesophageal reflux disease)   . Heart murmur   . Gestational diabetes       Review of Systems  Respiratory: Positive for apnea and choking. Negative for cough and wheezing.   Cardiovascular: Negative.   Gastrointestinal: Positive for nausea. Negative for vomiting, abdominal pain,  diarrhea, constipation, blood in stool, abdominal distention, anal bleeding and rectal pain.  Neurological: Negative.   All other systems reviewed and are negative.   Filed Vitals:   07/01/13 1049  BP: 130/64  Pulse: 74  Temp: 98.3 F (36.8 C)  TempSrc: Oral  Height: 5\' 1"  (1.549 m)  Weight: 261 lb 8 oz (118.616 kg)       Objective:   Physical Exam  Nursing note and vitals reviewed. Constitutional: She appears well-developed. No distress.  Cardiovascular: Normal rate, regular rhythm, normal heart sounds and intact distal pulses.   No murmur heard. Pulmonary/Chest: Effort normal and breath sounds normal. No respiratory distress. She has no wheezes. She has no rales.  Abdominal: Soft. Bowel sounds are normal. She exhibits no distension and no mass. There is no tenderness. There is no guarding.  Musculoskeletal: Normal range of motion. She exhibits no edema.       Assessment:     Nausea HTN HLD Sleep apnea assessment     Plan:     Check problem list.

## 2013-07-01 NOTE — Patient Instructions (Signed)
Sleep Apnea  Sleep apnea is disorder that affects a person's sleep. A person with sleep apnea has abnormal pauses in their breathing when they sleep. It is hard for them to get a good sleep. This makes a person tired during the day. It also can lead to other physical problems. There are three types of sleep apnea. One type is when breathing stops for a short time because your airway is blocked (obstructive sleep apnea). Another type is when the brain sometimes fails to give the normal signal to breathe to the muscles that control your breathing (central sleep apnea). The third type is a combination of the other two types.  HOME CARE  · Do not sleep on your back. Try to sleep on your side.  · Take all medicine as told by your doctor.  · Avoid alcohol, calming medicines (sedatives), and depressant drugs.  · Try to lose weight if you are overweight. Talk to your doctor about a healthy weight goal.  Your doctor may have you use a device that helps to open your airway. It can help you get the air that you need. It is called a positive airway pressure (PAP) device. There are three types of PAP devices:  · Continuous positive airway pressure (CPAP) device.  · Nasal expiratory positive airway pressure (EPAP) device.  · Bilevel positive airway pressure (BPAP) device.  MAKE SURE YOU:  · Understand these instructions.  · Will watch your condition.  · Will get help right away if you are not doing well or get worse.  Document Released: 02/29/2008 Document Revised: 05/08/2012 Document Reviewed: 09/23/2011  ExitCare® Patient Information ©2014 [employer].

## 2013-07-01 NOTE — Assessment & Plan Note (Signed)
-  Continue Lipitor °

## 2013-07-01 NOTE — Assessment & Plan Note (Signed)
Diet and exercise recommended for weight loss. Slit sleep study ordered.

## 2013-07-01 NOTE — Assessment & Plan Note (Signed)
Chronic recurrent. Etiology unclear,she had had GI assessment in the past including colonoscopy. Plan to try Phenergan prn nausea. I started her on Protonix for GERD since her symptom might be related to GERD. If symptom persist might need GI reevaluation.

## 2013-07-02 ENCOUNTER — Other Ambulatory Visit: Payer: Self-pay | Admitting: Family Medicine

## 2013-07-02 MED ORDER — LISINOPRIL-HYDROCHLOROTHIAZIDE 20-25 MG PO TABS: 1.0000 | ORAL_TABLET | Freq: Every day | ORAL | Status: DC

## 2013-07-03 ENCOUNTER — Other Ambulatory Visit (INDEPENDENT_AMBULATORY_CARE_PROVIDER_SITE_OTHER): Payer: Medicaid Other

## 2013-07-03 DIAGNOSIS — E785 Hyperlipidemia, unspecified: Secondary | ICD-10-CM

## 2013-07-03 DIAGNOSIS — I10 Essential (primary) hypertension: Secondary | ICD-10-CM

## 2013-07-03 DIAGNOSIS — R011 Cardiac murmur, unspecified: Secondary | ICD-10-CM

## 2013-07-03 LAB — COMPREHENSIVE METABOLIC PANEL
ALT: 19 U/L (ref 0–35)
AST: 18 U/L (ref 0–37)
Albumin: 3.8 g/dL (ref 3.5–5.2)
Alkaline Phosphatase: 60 U/L (ref 39–117)
BUN: 9 mg/dL (ref 6–23)
CO2: 27 mEq/L (ref 19–32)
Calcium: 9 mg/dL (ref 8.4–10.5)
Chloride: 102 mEq/L (ref 96–112)
Creatinine, Ser: 0.8 mg/dL (ref 0.4–1.2)
GFR: 103.32 mL/min (ref >60.00)
Glucose, Bld: 93 mg/dL (ref 70–99)
Potassium: 3.9 mEq/L (ref 3.5–5.1)
Sodium: 136 mEq/L (ref 135–145)
Total Bilirubin: 0.6 mg/dL (ref 0.3–1.2)
Total Protein: 7.3 g/dL (ref 6.0–8.3)

## 2013-07-03 LAB — LIPID PANEL
Cholesterol: 120 mg/dL (ref 0–200)
HDL: 38.5 mg/dL — ABNORMAL LOW (ref >39.00)
LDL Cholesterol: 55 mg/dL (ref 0–99)
Total CHOL/HDL Ratio: 3
Triglycerides: 133 mg/dL (ref 0.0–149.0)
VLDL: 26.6 mg/dL (ref 0.0–40.0)

## 2013-07-04 ENCOUNTER — Ambulatory Visit (INDEPENDENT_AMBULATORY_CARE_PROVIDER_SITE_OTHER): Payer: Medicaid Other | Admitting: Cardiology

## 2013-07-04 ENCOUNTER — Encounter: Payer: Self-pay | Admitting: Cardiology

## 2013-07-04 VITALS — BP 128/74 | HR 72 | Ht 61.0 in | Wt 263.0 lb

## 2013-07-04 DIAGNOSIS — R079 Chest pain, unspecified: Secondary | ICD-10-CM

## 2013-07-04 MED ORDER — HYDROCHLOROTHIAZIDE 25 MG PO TABS: 25.0000 mg | ORAL_TABLET | Freq: Every day | ORAL | Status: DC

## 2013-07-04 MED ORDER — ATORVASTATIN CALCIUM 10 MG PO TABS: 10.0000 mg | ORAL_TABLET | Freq: Every day | ORAL | Status: DC

## 2013-07-04 MED ORDER — LOSARTAN POTASSIUM 50 MG PO TABS: 50.0000 mg | ORAL_TABLET | Freq: Every day | ORAL | Status: DC

## 2013-07-04 MED ORDER — NITROGLYCERIN 0.4 MG SL SUBL: 0.4000 mg | SUBLINGUAL_TABLET | SUBLINGUAL | Status: DC | PRN

## 2013-07-04 NOTE — Patient Instructions (Addendum)
Your physician has requested that you have cardiac CT. Cardiac computed tomography (CT) is a painless test that uses an x-ray machine to take clear, detailed pictures of your heart. For further information please visit [email or web address]. Please follow instruction sheet as given.  Your physician has recommended you make the following change in your medication:   1. Stop Lisinopril-HCTZ  2. Start Losartan 50 mg 1 tab daily 3. Start HCTZ 25 mg 1 tab daily     Your physician wants you to follow-up in: 1 year with Dr. Johann Capers will receive a reminder letter in the mail two months in advance. If you don't receive a letter, please call our office to schedule the follow-up appointment.

## 2013-07-04 NOTE — Progress Notes (Signed)
Patient ID: Michelle Trevino, female   DOB: [birth year], 43 y.o.   MRN: [id number]  Patient Name: Michelle Trevino Date of Encounter: 07/04/2013  Primary Care Provider:  Andrena Mews, MD Primary Cardiologist:  Ena Dawley, H  Patient Profile  Chest pain  Problem List   Past Medical History  Diagnosis Date  . Polyuria   . Hemorrhoids   . Migraines   . Anxiety   . Asthma   . Hypertension   . Allergy   . GERD (gastroesophageal reflux disease)   . Heart murmur   . Gestational diabetes    Past Surgical History  Procedure Laterality Date  . Tubal ligation  2000  . Reversal of tubal ligation  2011  . Lipoma resection Left 11/2012    ankle    Allergies  No Known Allergies  HPI  Other pleasant 43 year old female with prior medical history of hypertension who presented to the ER one month ago with acute onset chest pain. She was prescribed diclofenac with some improvement in pain. The pain is sharp in character, gets worse when patient lays down and better when she sits up. It usually lasts for a few minutes but sometimes can last for hours. The patient denies any flu-like symptoms prior to starting this chest pain. She also denies any fever. She is complaining of a fatigued when she is trying to attempt any exertion. She feels palpitations with exertion and with any episode of chest pain. No syncope.  The patient is coming for followup after 2 months, she reports to exertional chest pain about twice a week that are relieved by sl NTG. Walking 1 flight of stairs would cause this pain.   Home Medications  Prior to Admission medications   Medication Sig Start Date End Date Taking? Authorizing Provider  AMITIZA 8 MCG capsule take 1 capsule by mouth twice a day with food 10/20/12  Yes Sable Feil, MD  diclofenac (VOLTAREN) 75 MG EC tablet Take 1 tablet (75 mg total) by mouth 2 (two) times daily. 02/14/13  Yes Amber Fidel Levy, MD  gabapentin (NEURONTIN) 300 MG capsule Take 300 mg  by mouth at bedtime.   Yes Historical Provider, MD  lisinopril-hydrochlorothiazide (PRINZIDE,ZESTORETIC) 20-25 MG per tablet Take 1 tablet by mouth daily. 02/14/13  Yes Amber Fidel Levy, MD  venlafaxine (EFFEXOR) 75 MG tablet Take 75 mg by mouth at bedtime.   Yes Historical Provider, MD    Family History  Family History  Problem Relation Age of Onset  . Diabetes Mother   . Hypertension Mother   . Osteoarthritis Mother   . Stroke Mother 67  . Hepatitis Mother   . Stomach cancer Father 67  . Stroke Brother 40  . Cancer Maternal Grandmother     unsure what type  . Colon cancer Neg Hx   . Esophageal cancer Neg Hx   . Rectal cancer Neg Hx     Social History  History   Social History  . Marital Status: Single    Spouse Name: N/A    Number of Children: N/A  . Years of Education: N/A   Occupational History  . Student    Social History Main Topics  . Smoking status: Former Smoker -- 0.50 packs/day    Quit date: 06/29/2012  . Smokeless tobacco: Never Used  . Alcohol Use: 0.6 oz/week    1 Glasses of wine per week     Comment: once every 6 mos.  . Drug Use: No  . Sexual  Activity: Yes    Birth Control/ Protection: None   Other Topics Concern  . Not on file   Social History Narrative   Lives with same sex partner and each has children from another relationship.  All are patients at [facility].       Review of Systems General:  No chills, fever, night sweats or weight changes.  Cardiovascular:  + chest pain, + dyspnea on exertion, - edema, - orthopnea, + palpitations, - paroxysmal nocturnal dyspnea on exertion. Dermatological: No rash, lesions/masses Respiratory: No cough, + dyspnea Urologic: No hematuria, dysuria Abdominal:   No nausea, vomiting, diarrhea, bright red blood per rectum, melena, or hematemesis Neurologic:  No visual changes, wkns, changes in mental status. All other systems reviewed and are otherwise negative except as noted above.  Physical Exam  There  were no vitals taken for this visit.  General: Pleasant, NAD Psych: Normal affect. Neuro: Alert and oriented X 3. Moves all extremities spontaneously. HEENT: Normal  Neck: Supple without bruits or JVD. Lungs:  Resp regular and unlabored, CTA. Heart: RRR no s3, s4, or murmurs, no rub. Abdomen: Soft, non-tender, non-distended, BS + x 4.  Extremities: No clubbing, cyanosis or edema. DP/PT/Radials 2+ and equal bilaterally.  Accessory Clinical Findings  ECG - Sinus tachycardia, 104 BPM, LVH, nonspecific T wave changes  Lipid Panel     Component Value Date/Time   CHOL 120 07/03/2013 1108   TRIG 133.0 07/03/2013 1108   HDL 38.50* 07/03/2013 1108   CHOLHDL 3 07/03/2013 1108   VLDL 26.6 07/03/2013 1108   LDLCALC 55 07/03/2013 1108    TTE 03/28/2013  Left ventricle: The cavity size was normal. Wall thickness was increased in a pattern of mild LVH. Systolic function was normal. The estimated ejection fraction was in the range of 60% to 65%. Wall motion was normal; there were no regional wall motion abnormalities. The transmitral flow pattern was normal. The deceleration time of the early transmitral flow velocity was normal. The pulmonary vein flow pattern was normal. The tissue Doppler parameters were normal. Left ventricular diastolic function parameters were normal. ------------------------------------------------------------ Aortic valve: Structurally normal valve. Trileaflet. Cusp separation was normal. Doppler: Transvalvular velocity was within the normal range. There was no stenosis. No regurgitation. ------------------------------------------------------------ Aorta: The aorta was normal, not dilated, and non-diseased. ----------------------------------------------------------- Mitral valve: Structurally normal valve. Leaflet separation was normal. Doppler: Transvalvular velocity was within the normal range. There was no evidence for stenosis. No regurgitation. Peak gradient:  13mm Hg (D). ------------------------------------------------------------ Left atrium: The atrium was normal in size. ------------------------------------------------------------ Right ventricle: The cavity size was normal. Wall thickness was normal. Systolic function was normal. ------------------------------------------------------------ Pulmonic valve: Structurally normal valve. Cusp separation was normal. Doppler: Transvalvular velocity was within the normal range. No regurgitation. ------------------------------------------------------------ Tricuspid valve: Structurally normal valve. Leaflet separation was normal. Doppler: Transvalvular velocity was within the normal range. Mild regurgitation. ------------------------------------------------------------ Right atrium: The atrium was normal in size. ----------------------------------------------------------- Pericardium: The pericardium was normal in appearance. ----------------------------------------------------------- Systemic veins: Inferior vena cava: The vessel was normal in size; the respirophasic diameter changes were in the normal range (= 50%); findings are consistent with normal central venous pressure. ------------------------------------------------------------ Post procedure conclusions Ascending Aorta: - The aorta was normal, not dilated, and non-diseased.   Exercise MIBI 04/16/13  Quantitative Gated Spect Images  QGS EDV: 93 ml  QGS ESV: 42 ml  Impression  Exercise Capacity: Poor exercise capacity.  BP Response: Normal blood pressure response.  Clinical Symptoms: Fatigue  ECG Impression: No significant ST segment  change suggestive of ischemia.  Comparison with Prior Nuclear Study: No previous nuclear study performed  Overall Impression: Normal stress nuclear study.  LV Ejection Fraction: 55%. LV Wall Motion: NL LV Function; NL Wall Motion   Assessment & Plan  43 year old female with multiple risk  factors for CAD including hypertension, hyperlipidemia, diabetes mellitus  1. Chest pain - no improvement on NSAIDS and colchine after 3 weeks of therapy. Exercise Myoview showed normal LV function, no perfusion defects but poor functional capacity. The patient continues having typical chest pain exxacerbated by stress and relieved by rest and NTG.  We will order a coronary CT to evaluate for the degree of atherosclerotic plague, especially that the patient is wishing to start an exercise program in order to loose weight.  2. HTN- controlled - mild LVH on echocardiogram, continue Metoprolol 25 mg bid, lisinopril 20 mg daily, hydrochlorothiazide 25 mg daily  3. Hyperlipidemia - high LDL and TAG, after starting low dose of LIpitor 10 mg po daily both at goal. No muscle pain, stable liver enzymes.   Follow up in 1 year unless coronary CT abnormal..   Ena Dawley, Lemmie Evens, MD 07/04/2013, 12:01 PM

## 2013-07-08 ENCOUNTER — Encounter: Payer: Self-pay | Admitting: Cardiology

## 2013-07-10 ENCOUNTER — Ambulatory Visit (HOSPITAL_COMMUNITY)
Admission: RE | Admit: 2013-07-10 | Discharge: 2013-07-10 | Disposition: A | Discharge status: Home / Self Care | Payer: Medicaid Other | Source: Ambulatory Visit | Attending: Cardiology | Admitting: Cardiology

## 2013-07-10 DIAGNOSIS — R079 Chest pain, unspecified: Secondary | ICD-10-CM | POA: Insufficient documentation

## 2013-07-10 MED ORDER — NITROGLYCERIN 0.4 MG SL SUBL
0.4000 mg | SUBLINGUAL_TABLET | SUBLINGUAL | Status: DC | PRN
  Administered 2013-07-10: 0.4 mg via SUBLINGUAL
  Filled 2013-07-10: qty 25

## 2013-07-10 MED ORDER — IOHEXOL 350 MG/ML SOLN
80.0000 mL | Freq: Once | INTRAVENOUS | Status: AC | PRN
  Administered 2013-07-10: 80 mL via INTRAVENOUS

## 2013-07-10 MED ORDER — METOPROLOL TARTRATE 1 MG/ML IV SOLN
INTRAVENOUS | Status: AC
  Filled 2013-07-10: qty 10

## 2013-07-10 MED ORDER — NITROGLYCERIN 0.4 MG SL SUBL
SUBLINGUAL_TABLET | SUBLINGUAL | Status: AC
  Filled 2013-07-10: qty 25

## 2013-07-10 MED ORDER — METOPROLOL TARTRATE 1 MG/ML IV SOLN
INTRAVENOUS | Status: AC
  Filled 2013-07-10: qty 5

## 2013-07-10 MED ORDER — METOPROLOL TARTRATE 1 MG/ML IV SOLN
5.0000 mg | INTRAVENOUS | Status: DC | PRN
  Administered 2013-07-10 (×2): 5 mg via INTRAVENOUS
  Filled 2013-07-10: qty 5

## 2013-07-10 MED ORDER — METOPROLOL TARTRATE 1 MG/ML IV SOLN
5.0000 mg | Freq: Once | INTRAVENOUS | Status: AC
  Administered 2013-07-10: 5 mg via INTRAVENOUS
  Filled 2013-07-10: qty 5

## 2013-07-14 ENCOUNTER — Telehealth: Payer: Self-pay | Admitting: Cardiology

## 2013-07-14 ENCOUNTER — Other Ambulatory Visit: Payer: Medicaid Other

## 2013-07-14 NOTE — Telephone Encounter (Signed)
Spoke with pt, aware CT results were normal.

## 2013-07-14 NOTE — Telephone Encounter (Signed)
New message    Returning a nurses call----probably to get lab results.

## 2013-07-15 ENCOUNTER — Ambulatory Visit: Payer: Medicaid Other | Admitting: Cardiology

## 2013-07-30 ENCOUNTER — Encounter (HOSPITAL_BASED_OUTPATIENT_CLINIC_OR_DEPARTMENT_OTHER): Payer: Medicaid Other

## 2013-08-12 ENCOUNTER — Ambulatory Visit (HOSPITAL_BASED_OUTPATIENT_CLINIC_OR_DEPARTMENT_OTHER): Payer: Medicaid Other | Attending: Family Medicine

## 2013-08-12 VITALS — Ht 61.0 in | Wt 260.0 lb

## 2013-08-12 DIAGNOSIS — G4733 Obstructive sleep apnea (adult) (pediatric): Secondary | ICD-10-CM | POA: Insufficient documentation

## 2013-08-12 DIAGNOSIS — G4730 Sleep apnea, unspecified: Secondary | ICD-10-CM

## 2013-08-16 DIAGNOSIS — G4730 Sleep apnea, unspecified: Secondary | ICD-10-CM

## 2013-08-16 NOTE — Sleep Study (Signed)
   NAME: Michelle Trevino DATE OF BIRTH:  08/07/1970 MEDICAL RECORD NUMBER [id number]  LOCATION: [facility]  PHYSICIAN: YOUNG,CLINTON D  DATE OF STUDY: 08/12/2013  SLEEP STUDY TYPE: Nocturnal Polysomnogram               REFERRING PHYSICIAN: Andrena Mews, MD  INDICATION FOR STUDY: Hypersomnia with sleep apnea  EPWORTH SLEEPINESS SCORE:   8/24 HEIGHT: 5\' 1"  (154.9 cm)  WEIGHT: 260 lb (117.935 kg)    Body mass index is 49.15 kg/(m^2).  NECK SIZE: 16 in.  MEDICATIONS: Charted for review  SLEEP ARCHITECTURE: Total sleep time 364.5 minutes with sleep efficiency 92.7%. Stage I was 6.4%, stage II 77.9%, stage III 1.8%, REM 13.9% of total sleep time. Sleep latency 16.5 minutes, REM latency 258.5 minutes, awake after sleep onset 13 minutes, arousal index 8.6. Bedtime medication: Lipitor, HCTZ, losartan, nortriptyline, Protonix., gabapentin  RESPIRATORY DATA: Apnea hypopnea index (AHI) 9.9 per hour. 60 total events scored including 16 obstructive apneas, 44 hypopneas. All events were while supine. REM AHI 67.7 per hour. Most respiratory events were associated with REM. There were not enough events to qualify for split protocol CPAP titration.  OXYGEN DATA: Moderately loud snoring with oxygen desaturation to a nadir of 82% and mean oxygen saturation through the study of 94.1% on room air.  CARDIAC DATA: Sinus rhythm with occasional PVC  MOVEMENT/PARASOMNIA: No significant movement disturbance, no bathroom trips  IMPRESSION/ RECOMMENDATION:   1) Mild obstructive sleep apnea/hypopneas syndrome, AHI 9.9 per hour with supine events, mostly associated with REM. Moderately loud snoring with oxygen desaturation to a nadir of 82% and mean oxygen saturation through the study of 94.1% on room air. 2) Scores in this range may respond to conservative management including weight loss and encouragement to sleep off flat of back. An oral appliance or CPAP may be appropriate on an  individual basis.  Signed Baird Lyons M.D. Deneise Lever Diplomate, American Board of Sleep Medicine  ELECTRONICALLY SIGNED ON:  08/16/2013, 1:56 PM [place] PH: [phone]   FX: [phone] [place]

## 2013-08-26 ENCOUNTER — Ambulatory Visit: Payer: Medicaid Other | Admitting: Family Medicine

## 2013-08-27 ENCOUNTER — Encounter (HOSPITAL_BASED_OUTPATIENT_CLINIC_OR_DEPARTMENT_OTHER): Payer: Medicaid Other

## 2013-09-14 ENCOUNTER — Emergency Department (HOSPITAL_COMMUNITY)
Admission: EM | Admit: 2013-09-14 | Discharge: 2013-09-14 | Disposition: A | Discharge status: Home / Self Care | Payer: Medicaid Other | Source: Home / Self Care

## 2013-09-14 ENCOUNTER — Encounter (HOSPITAL_COMMUNITY): Payer: Self-pay | Admitting: Emergency Medicine

## 2013-09-14 DIAGNOSIS — H109 Unspecified conjunctivitis: Secondary | ICD-10-CM

## 2013-09-14 DIAGNOSIS — A499 Bacterial infection, unspecified: Secondary | ICD-10-CM

## 2013-09-14 DIAGNOSIS — B9689 Other specified bacterial agents as the cause of diseases classified elsewhere: Secondary | ICD-10-CM

## 2013-09-14 DIAGNOSIS — H1089 Other conjunctivitis: Secondary | ICD-10-CM

## 2013-09-14 HISTORY — DX: Unspecified mononeuropathy of right lower limb: G57.91

## 2013-09-14 MED ORDER — ERYTHROMYCIN 5 MG/GM OP OINT: TOPICAL_OINTMENT | OPHTHALMIC | Status: DC

## 2013-09-14 NOTE — ED Provider Notes (Signed)
Medical screening examination/treatment/procedure(s) were performed by resident physician or non-physician practitioner and as supervising physician I was immediately available for consultation/collaboration.   Pauline Good MD.   Billy Fischer, MD 09/14/13 1153

## 2013-09-14 NOTE — Discharge Instructions (Signed)
Bacterial Conjunctivitis  Bacterial conjunctivitis, commonly called pink eye, is an inflammation of the clear membrane that covers the white part of the eye (conjunctiva). The inflammation can also happen on the underside of the eyelids. The blood vessels in the conjunctiva become inflamed causing the eye to become red or pink. Bacterial conjunctivitis may spread easily from one eye to another and from person to person (contagious).   CAUSES   Bacterial conjunctivitis is caused by bacteria. The bacteria may come from your own skin, your upper respiratory tract, or from someone else with bacterial conjunctivitis.  SYMPTOMS   The normally white color of the eye or the underside of the eyelid is usually pink or red. The pink eye is usually associated with irritation, tearing, and some sensitivity to light. Bacterial conjunctivitis is often associated with a thick, yellowish discharge from the eye. The discharge may turn into a crust on the eyelids overnight, which causes your eyelids to stick together. If a discharge is present, there may also be some blurred vision in the affected eye.  DIAGNOSIS   Bacterial conjunctivitis is diagnosed by your caregiver through an eye exam and the symptoms that you report. Your caregiver looks for changes in the surface tissues of your eyes, which may point to the specific type of conjunctivitis. A sample of any discharge may be collected on a cotton-tip swab if you have a severe case of conjunctivitis, if your cornea is affected, or if you keep getting repeat infections that do not respond to treatment. The sample will be sent to a lab to see if the inflammation is caused by a bacterial infection and to see if the infection will respond to antibiotic medicines.  TREATMENT   · Bacterial conjunctivitis is treated with antibiotics. Antibiotic eyedrops are most often used. However, antibiotic ointments are also available. Antibiotics pills are sometimes used. Artificial tears or eye  washes may ease discomfort.  HOME CARE INSTRUCTIONS   · To ease discomfort, apply a cool, clean wash cloth to your eye for 10 20 minutes, 3 4 times a day.  · Gently wipe away any drainage from your eye with a warm, wet washcloth or a cotton ball.  · Wash your hands often with soap and water. Use paper towels to dry your hands.  · Do not share towels or wash cloths. This may spread the infection.  · Change or wash your pillow case every day.  · You should not use eye makeup until the infection is gone.  · Do not operate machinery or drive if your vision is blurred.  · Stop using contacts lenses. Ask your caregiver how to sterilize or replace your contacts before using them again. This depends on the type of contact lenses that you use.  · When applying medicine to the infected eye, do not touch the edge of your eyelid with the eyedrop bottle or ointment tube.  SEEK IMMEDIATE MEDICAL CARE IF:   · Your infection has not improved within 3 days after beginning treatment.  · You had yellow discharge from your eye and it returns.  · You have increased eye pain.  · Your eye redness is spreading.  · Your vision becomes blurred.  · You have a fever or persistent symptoms for more than 2 3 days.  · You have a fever and your symptoms suddenly get worse.  · You have facial pain, redness, or swelling.  MAKE SURE YOU:   · Understand these instructions.  · Will watch your   condition.  · Will get help right away if you are not doing well or get worse.  Document Released: 05/22/2005 Document Revised: 02/14/2012 Document Reviewed: 10/23/2011  ExitCare® Patient Information ©2014 [employer].

## 2013-09-14 NOTE — ED Notes (Signed)
Woke yesterday with some left eye tenderness without any drainage.  Throughout the day, pain increased and eye started swelling.  Noticed "bump" to inner lower lid.  Reports a "coating" over eye with each blink that clears thereafter.  No contact lens usage.

## 2013-09-14 NOTE — ED Provider Notes (Signed)
CSN: [id number]     Arrival date & time 09/14/13  0935 History   First MD Initiated Contact with Patient 09/14/13 1026     Chief Complaint  Patient presents with  . Eye Pain   (Consider location/radiation/quality/duration/timing/severity/associated sxs/prior Treatment) HPI Comments: Yesterday experienced soreness to the outer corner of the OS, getting worse through day and into this AM. Feels like something in the corner of the eye lower lid. Scant clear drainage.   Past Medical History  Diagnosis Date  . Polyuria   . Hemorrhoids   . Migraines   . Anxiety   . Asthma   . Hypertension   . Allergy   . GERD (gastroesophageal reflux disease)   . Heart murmur   . Gestational diabetes   . Nerve damage of right foot     R/T lipoma   Past Surgical History  Procedure Laterality Date  . Tubal ligation  2000  . Reversal of tubal ligation  2011  . Lipoma resection Left 11/2012    ankle   Family History  Problem Relation Age of Onset  . Diabetes Mother   . Hypertension Mother   . Osteoarthritis Mother   . Stroke Mother 67  . Hepatitis Mother   . Stomach cancer Father 35  . Stroke Brother 40  . Cancer Maternal Grandmother     unsure what type  . Colon cancer Neg Hx   . Esophageal cancer Neg Hx   . Rectal cancer Neg Hx    History  Substance Use Topics  . Smoking status: Former Smoker -- 0.50 packs/day    Quit date: 06/29/2012  . Smokeless tobacco: Never Used  . Alcohol Use: 0.6 oz/week    1 Glasses of wine per week     Comment: every 2 wks   OB History   Grav Para Term Preterm Abortions TAB SAB Ect Mult Living   1  0 0   0 0 0      Obstetric Comments   [id number] TAB's     Review of Systems  Constitutional: Negative.   Eyes: Positive for pain, discharge, redness and itching. Negative for photophobia.  Skin: Negative.   All other systems reviewed and are negative.   Allergies  Lisinopril  Home Medications   Current Outpatient Rx  Name  Route  Sig  Dispense   Refill  . atorvastatin (LIPITOR) 10 MG tablet   Oral   Take 1 tablet (10 mg total) by mouth daily.   30 tablet   3   . gabapentin (NEURONTIN) 100 MG capsule   Oral   Take 3 capsules (300 mg total) by mouth 3 (three) times daily.   270 capsule   12   . hydrochlorothiazide (HYDRODIURIL) 25 MG tablet   Oral   Take 1 tablet (25 mg total) by mouth daily.   90 tablet   3   . losartan (COZAAR) 50 MG tablet   Oral   Take 1 tablet (50 mg total) by mouth daily.   90 tablet   3   . nitroGLYCERIN (NITROSTAT) 0.4 MG SL tablet   Sublingual   Place 1 tablet (0.4 mg total) under the tongue every 5 (five) minutes as needed for chest pain.   25 tablet   3   . nortriptyline (PAMELOR) 25 MG capsule      One tab po qhs xone week, then 2 tabs po qhs   60 capsule   12   . pantoprazole (PROTONIX) 40 MG  tablet   Oral   Take 1 tablet (40 mg total) by mouth daily.   30 tablet   3   . promethazine (PHENERGAN) 25 MG tablet   Oral   Take 1 tablet (25 mg total) by mouth every 8 (eight) hours as needed for nausea.   30 tablet   2   . zolmitriptan (ZOMIG) 5 MG nasal solution   Nasal   Place 1 spray into the nose as needed for migraine.   6 Units   12   . erythromycin ophthalmic ointment      Place a 1/2 inch ribbon of ointment into the lower left eyelid tid   1 g   0    BP 123/82  Pulse 105  Temp(Src) 98.3 F (36.8 C) (Oral)  Resp 20  SpO2 99% Physical Exam  Nursing note and vitals reviewed. Constitutional: She is oriented to person, place, and time. She appears well-developed and well-nourished. No distress.  Eyes: EOM are normal. Pupils are equal, round, and reactive to light. Left conjunctiva is injected.    There is a pustule to the base of the lower eye lid involving the conjunctiva. Does not extend onto the edge of the lid. Approx 1-2 mm in diameter with an adjacent smaller whitish exudate. No drainage from this site. Remainder of the eye, sclera (clear), anterior  chamber, upper lid, pupil appear nl and unaffected.  No FB seen under magnification.  Neck: Normal range of motion. Neck supple.  Neurological: She is alert and oriented to person, place, and time. She exhibits normal muscle tone.  Skin: Skin is warm and dry.  Psychiatric: She has a normal mood and affect.    ED Course  Procedures (including critical care time) Labs Review Labs Reviewed - No data to display Imaging Review No results found.   MDM   1. Bacterial conjunctivitis of left eye    Erythromycin op oint tid Warm compresses frequently If no improvement or worsening Call Dr. Posey Pronto as above, may return    Janne Napoleon, NP 09/14/13 1051

## 2013-09-19 ENCOUNTER — Ambulatory Visit (INDEPENDENT_AMBULATORY_CARE_PROVIDER_SITE_OTHER): Payer: Medicaid Other

## 2013-09-19 VITALS — BP 117/47 | HR 99 | Resp 18

## 2013-09-19 DIAGNOSIS — M779 Enthesopathy, unspecified: Secondary | ICD-10-CM

## 2013-09-19 DIAGNOSIS — S90129A Contusion of unspecified lesser toe(s) without damage to nail, initial encounter: Secondary | ICD-10-CM

## 2013-09-19 DIAGNOSIS — S9001XA Contusion of right ankle, initial encounter: Secondary | ICD-10-CM

## 2013-09-19 DIAGNOSIS — S93499A Sprain of other ligament of unspecified ankle, initial encounter: Secondary | ICD-10-CM

## 2013-09-19 DIAGNOSIS — S93409A Sprain of unspecified ligament of unspecified ankle, initial encounter: Secondary | ICD-10-CM

## 2013-09-19 DIAGNOSIS — S96819A Strain of other specified muscles and tendons at ankle and foot level, unspecified foot, initial encounter: Secondary | ICD-10-CM

## 2013-09-19 NOTE — Patient Instructions (Signed)
ICE INSTRUCTIONS  Apply ice or cold pack to the affected area at least 3 times a day for 10-15 minutes each time.  You should also use ice after prolonged activity or vigorous exercise.  Do not apply ice longer than 20 minutes at one time.  Always keep a cloth between your skin and the ice pack to prevent burns.  Being consistent and following these instructions will help control your symptoms.  We suggest you purchase a gel ice pack because they are reusable and do bit leak.  Some of them are designed to wrap around the area.  Use the method that works best for you.  Here are some other suggestions for icing.   Use a frozen bag of peas or corn-inexpensive and molds well to your body, usually stays frozen for 10 to 20 minutes.  Wet a towel with cold water and squeeze out the excess until it's damp.  Place in a bag in the freezer for 20 minutes. Then remove and use.  Recommended ice or Advil as needed for pain maintain ankle stabilizer for at least 4-6 weeks

## 2013-09-19 NOTE — Progress Notes (Signed)
   Subjective:    Patient ID: Michelle Trevino, female    DOB: 27-May-1971, 43 y.o.   MRN: [id number]  HPI I fell 4/13 and missed the last step coming out from my house and no swelling and used ice pack on the right ankle    Review of Systems no new systemic changes or findings noted     Objective:   Physical Exam Neurovascular status is intact pedal pulses palpable bilateral epicritic and proprioceptive sensations intact. Patient is a well-healed scar from abnormal removal lateral right ankle area however this time patient fell down some stairs and twisted her ankle and Rt. foot resulting in an inversion-type ankle sprain continues to have pain in the sinus tarsi overlying the anterior talofibular ligament area x-rays reveal no fracture no osseous abnormality ankle mortise is intact and stable I cannot rule out soft tissue injury anterior talofibular ligament posse peroneal tendons although more the pain is in the anterior sinus tarsi area. Minimal edema is noted patient also injured her left knee       Assessment & Plan:  Assessment this time lateral ankle sprain over the anterior talofibular ligament. Patient has mild edema no significant ecchymosis is noted maintain ankle stabilizer which is applied and maintained for 46 week duration contact her followup with the next month or up to 3 months if fails to improve. Also recommended Advil and ice as needed for pain in the interim next  Harriet Masson DPM

## 2013-09-24 NOTE — Progress Notes (Signed)
Mailed patients Medical Records that the [facility] requested on patient. Unable to fax to fax number given. JMS

## 2013-10-10 ENCOUNTER — Encounter: Payer: Self-pay | Admitting: Nurse Practitioner

## 2013-10-10 ENCOUNTER — Ambulatory Visit (INDEPENDENT_AMBULATORY_CARE_PROVIDER_SITE_OTHER): Payer: Medicaid Other | Admitting: Nurse Practitioner

## 2013-10-10 VITALS — BP 116/73 | HR 104 | Ht 62.5 in | Wt 262.0 lb

## 2013-10-10 DIAGNOSIS — R209 Unspecified disturbances of skin sensation: Secondary | ICD-10-CM

## 2013-10-10 DIAGNOSIS — R202 Paresthesia of skin: Secondary | ICD-10-CM

## 2013-10-10 DIAGNOSIS — R200 Anesthesia of skin: Secondary | ICD-10-CM

## 2013-10-10 DIAGNOSIS — G43909 Migraine, unspecified, not intractable, without status migrainosus: Secondary | ICD-10-CM

## 2013-10-10 MED ORDER — CETIRIZINE HCL 5 MG PO TABS: 5.0000 mg | ORAL_TABLET | ORAL | Status: DC | PRN

## 2013-10-10 NOTE — Progress Notes (Signed)
[facility]  PATIENT: Michelle Trevino DOB: 02-Jan-1971   REASON FOR VISIT: Followup for migraine    HISTORY OF PRESENT ILLNESS:Ms Trevino, 43 year old female returns for followup she has a 6-1/2 year history of migraine. She is back on her nortriptyline and using Zomig acutely with good results her headaches are in much better control. She is not aware of any triggers except stress.She had numbness tingling at bilateral lateral foot. She has neurontin 340m qday, it has been helpful. She returns for reevaluation.     HISTORY: She has past medical history of headaches for 6 years, typical migraine is bilateral retro-orbital severe pounding headaches, with associated light noise sensitivity, worsening by movement, improved by sleeping dark quiet room, she has couple typical migraine headaches a month, there is no clear triggering event,  In addition she complains of almost daily bilateral temporal area mild pressure headaches, over the years, she has tried different medications, including , Topamax, Zomig, Imitrex without helping her headaches  She is tolerating nortriptyline, no significant side effects, she has mild improvement of her headaches, she continued to have daily mild bilateral frontal headache, takes Tylenol occasionally, one to 2 times a week, she would have much more severe pounding headaches, her typical migraine headaches, if she catches it early on, Maxalt has been helpful, if she was not able to abort her migraine early, she may develop more prolonged severe headaches, her headache has to run through its course, lasting one to 2 hours, sleep usually helps.  UPDATE Jan 27th 2015:  Last BOTOX injection was in August, 2014, which has been very helpful. She also stopped her nortriptyline, She began to noticed increased migraine since Jan 2015, she has migraine 3-4 times a week, she has not taken Maxalt for a while, it has made her sleepy. Triggers for her  migraines are tension, lights, strain of her eyes.  She had bilateral lateral ankle surgery for lipoma, in June 2014 at left ankle, right ankle Nov 2014. She had numbness tingling at bilateral lateral foot. She was given neurontin 309mqday, it has been helpful.   REVIEW OF SYSTEMS: Full 14 system review of systems performed and notable only for those listed, all others are neg:  Constitutional: N/A  Cardiovascular: N/A  Ear/Nose/Throat: Ringing in the ears  Skin: N/A  Eyes: N/A  Respiratory: N/A  Gastroitestinal: N/A  Hematology/Lymphatic: N/A  Endocrine: Excessive thirst Musculoskeletal:N/A  Allergy/Immunology: N/A  Neurological: Numbness  Psychiatric: N/A Sleep : NA   ALLERGIES: Allergies  Allergen Reactions  . Lisinopril     cough    HOME MEDICATIONS: Outpatient Prescriptions Prior to Visit  Medication Sig Dispense Refill  . atorvastatin (LIPITOR) 10 MG tablet Take 1 tablet (10 mg total) by mouth daily.  30 tablet  3  . gabapentin (NEURONTIN) 100 MG capsule Take 3 capsules (300 mg total) by mouth 3 (three) times daily.  270 capsule  12  . hydrochlorothiazide (HYDRODIURIL) 25 MG tablet Take 1 tablet (25 mg total) by mouth daily.  90 tablet  3  . losartan (COZAAR) 50 MG tablet Take 1 tablet (50 mg total) by mouth daily.  90 tablet  3  . nitroGLYCERIN (NITROSTAT) 0.4 MG SL tablet Place 1 tablet (0.4 mg total) under the tongue every 5 (five) minutes as needed for chest pain.  25 tablet  3  . nortriptyline (PAMELOR) 25 MG capsule One tab po qhs xone week, then 2 tabs po qhs  60 capsule  12  . promethazine (  PHENERGAN) 25 MG tablet Take 1 tablet (25 mg total) by mouth every 8 (eight) hours as needed for nausea.  30 tablet  2  . zolmitriptan (ZOMIG) 5 MG nasal solution Place 1 spray into the nose as needed for migraine.  6 Units  12  . erythromycin ophthalmic ointment Place a 1/2 inch ribbon of ointment into the lower left eyelid tid  1 g  0  . pantoprazole (PROTONIX) 40 MG  tablet Take 1 tablet (40 mg total) by mouth daily.  30 tablet  3   No facility-administered medications prior to visit.    PAST MEDICAL HISTORY: Past Medical History  Diagnosis Date  . Polyuria   . Hemorrhoids   . Migraines   . Anxiety   . Asthma   . Hypertension   . Allergy   . GERD (gastroesophageal reflux disease)   . Heart murmur   . Gestational diabetes   . Nerve damage of right foot     R/T lipoma    PAST SURGICAL HISTORY: Past Surgical History  Procedure Laterality Date  . Tubal ligation  2000  . Reversal of tubal ligation  2011  . Lipoma resection Left 11/2012    ankle    FAMILY HISTORY: Family History  Problem Relation Age of Onset  . Diabetes Mother   . Hypertension Mother   . Osteoarthritis Mother   . Stroke Mother 78  . Hepatitis Mother   . Stomach cancer Father 60  . Stroke Brother 40  . Cancer Maternal Grandmother     unsure what type  . Colon cancer Neg Hx   . Esophageal cancer Neg Hx   . Rectal cancer Neg Hx     SOCIAL HISTORY: History   Social History  . Marital Status: Married    Spouse Name: N/A    Number of Children: 4  . Years of Education: BA    Occupational History  . Student    Social History Main Topics  . Smoking status: Former Smoker -- 0.50 packs/day    Quit date: 06/29/2012  . Smokeless tobacco: Never Used  . Alcohol Use: 0.6 oz/week    1 Glasses of wine per week     Comment: every 2 wks  . Drug Use: No  . Sexual Activity: Yes     Comment: homosexual relationship   Other Topics Concern  . Not on file   Social History Narrative   Lives with same sex partner and each has children from another relationship.  All are patients at [facility].     Patient has her Bachelors degree   Patient has 4 children.   Patient is right handed.    Patient is married to [employer].            PHYSICAL EXAM  Filed Vitals:   10/10/13 1113  BP: 116/73  Pulse: 104  Height: 5' 2.5" (1.588 m)  Weight: 262 lb (118.842 kg)    Body mass index is 47.13 kg/(m^2).  Generalized: Well developed, morbidly obese female in no acute distress  Head: normocephalic and atraumatic,. Oropharynx benign  Neck: Supple, no carotid bruits  Cardiac: Regular rate rhythm, no murmur  Musculoskeletal: No deformity   Neurological examination   Mentation: Alert oriented to time, place, history taking. Follows all commands speech and language fluent  Cranial nerve II-XII: Pupils were equal round reactive to light extraocular movements were full, visual field were full on confrontational test. Facial sensation and strength were normal. hearing was intact to  finger rubbing bilaterally. Uvula tongue midline. head turning and shoulder shrug were normal and symmetric.Tongue protrusion into cheek strength was normal. Motor: normal bulk and tone, full strength in the BUE, BLE, fine finger movements normal, no pronator drift. No focal weakness Sensory: normal and symmetric to light touch, pinprick, and  vibration with the exception of hypersensitivity at bilateral lateral dorsal foot. Coordination: finger-nose-finger, heel-to-shin bilaterally, no dysmetria Reflexes: Brachioradialis 2/2, biceps 2/2, triceps 2/2, patellar 2/2, Achilles 2/2, plantar responses were flexor bilaterally. Gait and Station: Rising up from seated position without assistance, normal stance,  moderate stride, good arm swing, smooth turning, able to perform tiptoe, and heel walking without difficulty. Tandem gait is steady  DIAGNOSTIC DATA (LABS, IMAGING, TESTING) - I reviewed patient records, labs, notes, testing and imaging myself where available.  Lab Results  Component Value Date   WBC 3.9* 03/14/2013   HGB 12.4 03/14/2013   HCT 36.7 03/14/2013   MCV 86.3 03/14/2013   PLT 345.0 03/14/2013      Component Value Date/Time   NA 136 07/03/2013 1108   K 3.9 07/03/2013 1108   CL 102 07/03/2013 1108   CO2 27 07/03/2013 1108   GLUCOSE 93 07/03/2013 1108   BUN 9 07/03/2013  1108   CREATININE 0.8 07/03/2013 1108   CREATININE 0.74 09/20/2011 0901   CALCIUM 9.0 07/03/2013 1108   PROT 7.3 07/03/2013 1108   ALBUMIN 3.8 07/03/2013 1108   AST 18 07/03/2013 1108   ALT 19 07/03/2013 1108   ALKPHOS 60 07/03/2013 1108   BILITOT 0.6 07/03/2013 1108   GFRNONAA >60 03/10/2010 2310   GFRAA  Value: >60        The eGFR has been calculated using the MDRD equation. This calculation has not been validated in all clinical situations. eGFR's persistently <60 mL/min signify possible Chronic Kidney Disease. 03/10/2010 2310   Lab Results  Component Value Date   CHOL 120 07/03/2013   HDL 38.50* 07/03/2013   LDLCALC 55 07/03/2013   TRIG 133.0 07/03/2013   CHOLHDL 3 07/03/2013   Lab Results  Component Value Date   HGBA1C 6.0 02/14/2013    Lab Results  Component Value Date   TSH 0.62 03/14/2013      ASSESSMENT AND PLAN  43 y.o. year old female  has a past medical history of migraines currently well controlled on nortriptyline and Zomig spray; and Nerve damage of right foot. occurring after bilateral ankle lipoma surgery  Continue nortriptyline at current dose Continue Zomig spray acutely Continue Neurontin at current dose Given information on migraine triggers Zyrtec for seasonal allergies, may obtain from PCP if beneficial Followup in 6 months Dennie Bible, [facility], [facility], [facility] [address], [place], [place] [address] [phone]

## 2013-10-10 NOTE — Patient Instructions (Signed)
Continue nortriptyline at current dose Continue Zomig spray acutely Continue Neurontin at current dose Given information on migraine triggers Followup in 6 months

## 2013-10-29 ENCOUNTER — Encounter: Payer: Self-pay | Admitting: Emergency Medicine

## 2013-10-29 ENCOUNTER — Ambulatory Visit (INDEPENDENT_AMBULATORY_CARE_PROVIDER_SITE_OTHER): Payer: Medicaid Other | Admitting: Emergency Medicine

## 2013-10-29 VITALS — BP 136/84 | HR 111 | Ht 61.0 in | Wt 257.0 lb

## 2013-10-29 DIAGNOSIS — M25569 Pain in unspecified knee: Secondary | ICD-10-CM

## 2013-10-29 DIAGNOSIS — M25562 Pain in left knee: Principal | ICD-10-CM

## 2013-10-29 DIAGNOSIS — M25561 Pain in right knee: Secondary | ICD-10-CM | POA: Insufficient documentation

## 2013-10-29 MED ORDER — MELOXICAM 15 MG PO TABS: 15.0000 mg | ORAL_TABLET | Freq: Every day | ORAL | Status: DC

## 2013-10-29 NOTE — Patient Instructions (Signed)
It was nice to meet you!  You bruised or partially tore your right patellar tendon.  On the left you likely irritated the meniscus. Ice both knees 3 times a day for 20 minutes. Take meloxicam 1 pill daily for 10 days, then as need.  Do NOT take ibuprofen, aleve, naprosyn, advil while on this medicine. You can take tylenol as needed.  Follow up in 2 weeks.

## 2013-10-29 NOTE — Assessment & Plan Note (Signed)
Likely has some underlying arthritis given her obesity. Right knee pain consistent with patellar tendon bruising or partial tear. Left knee pain more consistent with urination of the lateral meniscus. Recommended icing 3 times a day. Meloxicam 15 mg daily for 10 days then as needed. Continue gentle exercises. Continue with her water aerobics. Followup in 2 weeks if there is no improvement. At that time would consider x-rays of the right knee and potentially referral to physical therapy.

## 2013-10-29 NOTE — Progress Notes (Signed)
   Subjective:    Patient ID: Michelle Trevino, female    DOB: 1970-07-26, 43 y.o.   MRN: [id number]  HPI [name] is here for bilateral knee pain.  She states that about 4 weeks ago she was walking her dogs and tripped. She fell directly on the right knee on concrete. She iced the knee that day and took some ibuprofen. It has improved some but still has significant pain, particularly with bending the knee. The pain is located around the patellar tendon. Denies any swelling in the knee. Denies any locking. Does hear and feel some popping. She states the pain improved some as she gets going.  About one week after falling on her right knee, she fell on her stairs landing on the left knee. The left knee does not bother her as much. Does have some pain on the lateral aspect of the knee.  Current Outpatient Prescriptions on File Prior to Visit  Medication Sig Dispense Refill  . atorvastatin (LIPITOR) 10 MG tablet Take 1 tablet (10 mg total) by mouth daily.  30 tablet  3  . cetirizine (ZYRTEC) 5 MG tablet Take 1 tablet (5 mg total) by mouth as needed for allergies.  30 tablet  1  . gabapentin (NEURONTIN) 100 MG capsule Take 3 capsules (300 mg total) by mouth 3 (three) times daily.  270 capsule  12  . hydrochlorothiazide (HYDRODIURIL) 25 MG tablet Take 1 tablet (25 mg total) by mouth daily.  90 tablet  3  . losartan (COZAAR) 50 MG tablet Take 1 tablet (50 mg total) by mouth daily.  90 tablet  3  . nitroGLYCERIN (NITROSTAT) 0.4 MG SL tablet Place 1 tablet (0.4 mg total) under the tongue every 5 (five) minutes as needed for chest pain.  25 tablet  3  . nortriptyline (PAMELOR) 25 MG capsule One tab po qhs xone week, then 2 tabs po qhs  60 capsule  12  . promethazine (PHENERGAN) 25 MG tablet Take 1 tablet (25 mg total) by mouth every 8 (eight) hours as needed for nausea.  30 tablet  2  . zolmitriptan (ZOMIG) 5 MG nasal solution Place 1 spray into the nose as needed for migraine.  6 Units  12   No  current facility-administered medications on file prior to visit.    I have reviewed and updated the following as appropriate: allergies and current medications SHx: former smoker   Review of Systems See HPI    Objective:   Physical Exam BP 136/84  Pulse 111  Ht 5\' 1"  (1.549 m)  Wt 257 lb (116.574 kg)  BMI 48.58 kg/m2 Gen: alert, cooperative, NAD Right knee: no erythema; mild edema around patellar tendon; very tender over patellar tendon insertion; no bony tenderness; no joint laxity; +crepitus Left knee: no erythema; mild swelling over lateral knee; mild tenderness over lateral joint line; no joint laxity; no crepitus     Assessment & Plan:

## 2013-11-22 ENCOUNTER — Encounter (HOSPITAL_COMMUNITY): Payer: Self-pay | Admitting: Emergency Medicine

## 2013-11-22 ENCOUNTER — Emergency Department (HOSPITAL_COMMUNITY): Payer: Medicaid Other

## 2013-11-22 ENCOUNTER — Emergency Department (HOSPITAL_COMMUNITY)
Admission: EM | Admit: 2013-11-22 | Discharge: 2013-11-22 | Disposition: A | Discharge status: Home / Self Care | Payer: Medicaid Other | Attending: Emergency Medicine | Admitting: Emergency Medicine

## 2013-11-22 DIAGNOSIS — F411 Generalized anxiety disorder: Secondary | ICD-10-CM | POA: Insufficient documentation

## 2013-11-22 DIAGNOSIS — Z8679 Personal history of other diseases of the circulatory system: Secondary | ICD-10-CM | POA: Insufficient documentation

## 2013-11-22 DIAGNOSIS — R011 Cardiac murmur, unspecified: Secondary | ICD-10-CM | POA: Insufficient documentation

## 2013-11-22 DIAGNOSIS — G43909 Migraine, unspecified, not intractable, without status migrainosus: Secondary | ICD-10-CM | POA: Insufficient documentation

## 2013-11-22 DIAGNOSIS — M25561 Pain in right knee: Secondary | ICD-10-CM

## 2013-11-22 DIAGNOSIS — Z8669 Personal history of other diseases of the nervous system and sense organs: Secondary | ICD-10-CM | POA: Insufficient documentation

## 2013-11-22 DIAGNOSIS — Z8719 Personal history of other diseases of the digestive system: Secondary | ICD-10-CM | POA: Insufficient documentation

## 2013-11-22 DIAGNOSIS — I10 Essential (primary) hypertension: Secondary | ICD-10-CM | POA: Insufficient documentation

## 2013-11-22 DIAGNOSIS — M25569 Pain in unspecified knee: Secondary | ICD-10-CM | POA: Insufficient documentation

## 2013-11-22 DIAGNOSIS — M25562 Pain in left knee: Secondary | ICD-10-CM

## 2013-11-22 DIAGNOSIS — G8911 Acute pain due to trauma: Secondary | ICD-10-CM | POA: Insufficient documentation

## 2013-11-22 DIAGNOSIS — Z8632 Personal history of gestational diabetes: Secondary | ICD-10-CM | POA: Insufficient documentation

## 2013-11-22 DIAGNOSIS — J45909 Unspecified asthma, uncomplicated: Secondary | ICD-10-CM | POA: Insufficient documentation

## 2013-11-22 DIAGNOSIS — Z79899 Other long term (current) drug therapy: Secondary | ICD-10-CM | POA: Insufficient documentation

## 2013-11-22 DIAGNOSIS — Z791 Long term (current) use of non-steroidal anti-inflammatories (NSAID): Secondary | ICD-10-CM | POA: Insufficient documentation

## 2013-11-22 DIAGNOSIS — Z87891 Personal history of nicotine dependence: Secondary | ICD-10-CM | POA: Insufficient documentation

## 2013-11-22 MED ORDER — TRAMADOL HCL 50 MG PO TABS: 50.0000 mg | ORAL_TABLET | Freq: Four times a day (QID) | ORAL | Status: DC | PRN

## 2013-11-22 NOTE — Discharge Instructions (Signed)
Be sure to read and understand instructions below prior to leaving the hospital. If your symptoms persist without any improvement in 1 week it is recommended that you follow up with orthopedics listed above. Use your pain medication as prescribed and do not operate heavy machinery while on pain medication.     TREATMENT  Rest, ice, elevation, and compression are the basic modes of treatment.   Apply ice to the sore area for 15 to 20 minutes, 3 to 4 times per day. Do this while you are awake for the first 2 days, or as directed. This can be stopped when the swelling goes away. Put the ice in a plastic bag and place a towel between the bag of ice and your skin.  Keep your leg elevated when possible to lessen swelling.  If your caregiver recommends crutches, use them as instructed for 1 week. Then, you may walk as tolerated.  Do not drive a vehicle on pain medication. ACTIVITY:            - Weight bearing as tolerated            - Exercises should be limited to pain free range of motion  Knee Immobilization:: This is used to support and protect an injured or painful knee. Knee immobilizers keep your knee from being used while it is healing.  Use powder to control irritation from sweat and friction.  Adjust the immobilizer to be firm but not tight. Signs of an immobilizer that is too tight include:   Swelling.   Numbness.   Color change in your foot or ankle.   Increased pain.  While resting, raise your leg above the level of your heart. This reduces throbbing and helps healing. Prop it up with pillows.  Remove the immobilizer to bathe and sleep. Wear it other times until you see your doctor again.               SEEK MEDICAL CARE IF:  You have an increase in bruising, swelling, or pain.  Your toes feel cold.  Pain relief is not achieved with medications.  EMERGENCY:: Your toes are numb or blue or you have severe pain.  You notice redness, swelling, warmth or increasing pain in your knee.  An  unexplained oral temperature above 102 F (38.9 C) develops.  COLD THERAPY DIRECTIONS:  Ice or gel packs can be used to reduce both pain and swelling. Ice is the most helpful within the first 24 to 48 hours after an injury or flareup from overusing a muscle or joint.  Ice is effective, has very few side effects, and is safe for most people to use.   If you expose your skin to cold temperatures for too long or without the proper protection, you can damage your skin or nerves. Watch for signs of skin damage due to cold.   HOME CARE INSTRUCTIONS  Follow these tips to use ice and cold packs safely.  Place a dry or damp towel between the ice and skin. A damp towel will cool the skin more quickly, so you may need to shorten the time that the ice is used.  For a more rapid response, add gentle compression to the ice.  Ice for no more than 10 to 20 minutes at a time. The bonier the area you are icing, the less time it will take to get the benefits of ice.  Check your skin after 5 minutes to make sure there are no signs of  a poor response to cold or skin damage.  Rest 20 minutes or more in between uses.  Once your skin is numb, you can end your treatment. You can test numbness by very lightly touching your skin. The touch should be so light that you do not see the skin dimple from the pressure of your fingertip. When using ice, most people will feel these normal sensations in this order: cold, burning, aching, and numbness.  Do not use ice on someone who cannot communicate their responses to pain, such as small children or people with dementia.   HOW TO MAKE AN ICE PACK  To make an ice pack, do one of the following:  Place crushed ice or a bag of frozen vegetables in a sealable plastic bag. Squeeze out the excess air. Place this bag inside another plastic bag. Slide the bag into a pillowcase or place a damp towel between your skin and the bag.  Mix 3 parts water with 1 part rubbing alcohol. Freeze the  mixture in a sealable plastic bag. When you remove the mixture from the freezer, it will be slushy. Squeeze out the excess air. Place this bag inside another plastic bag. Slide the bag into a pillowcase or place a damp towel between your s

## 2013-11-22 NOTE — ED Notes (Signed)
Declined W/C at D/C and was escorted to lobby by RN. 

## 2013-11-22 NOTE — ED Provider Notes (Signed)
CSN: [id number]     Arrival date & time 11/22/13  1553 History  This chart was scribed for non-physician practitioner working with Ezequiel Essex, MD by Mercy Moore, ED Scribe. This patient was seen in room TR05C/TR05C and the patient's care was started at 5:05 PM.   Chief Complaint  Patient presents with  . Knee Pain     The history is provided by the patient. No language interpreter was used.   HPI Comments: Michelle Trevino is a 43 y.o. female who presents to the Emergency Department complaining of continued, progressively worsening left knee pain after two consecutive falls that occurred 2 months ago. Patient also with mild right knee pain, but states the pain is much worse on the left knee.  Patient's falling attributed to dizziness caused by fluid behind the ears. Patient evaluated and prescribed Mobic for her resulting knee pain. Patient has been taking the Mobic as directed for one month now, but does not feel that it is helping. Patient reports relief of her knee swelling with treatment, but continued pain. Patient is ambulatory, but reports instability in the left knee and repeated popping. Patient reports that her pain is worse in the morning when first waking up and attempting to descend the stairs.  She denies fever, chills, numbness, tingling, erythema, or warmth of the knee.   Past Medical History  Diagnosis Date  . Polyuria   . Hemorrhoids   . Migraines   . Anxiety   . Asthma   . Hypertension   . Allergy   . GERD (gastroesophageal reflux disease)   . Heart murmur   . Gestational diabetes   . Nerve damage of right foot     R/T lipoma   Past Surgical History  Procedure Laterality Date  . Tubal ligation  2000  . Reversal of tubal ligation  2011  . Lipoma resection Left 11/2012    ankle   Family History  Problem Relation Age of Onset  . Diabetes Mother   . Hypertension Mother   . Osteoarthritis Mother   . Stroke Mother 17  . Hepatitis Mother   . Stomach cancer  Father 69  . Stroke Brother 40  . Cancer Maternal Grandmother     unsure what type  . Colon cancer Neg Hx   . Esophageal cancer Neg Hx   . Rectal cancer Neg Hx    History  Substance Use Topics  . Smoking status: Former Smoker -- 0.50 packs/day    Quit date: 06/29/2012  . Smokeless tobacco: Never Used  . Alcohol Use: 0.6 oz/week    1 Glasses of wine per week     Comment: every 2 wks   OB History   Grav Para Term Preterm Abortions TAB SAB Ect Mult Living   1  0 0   0 0 0      Obstetric Comments   [id number] TAB's     Review of Systems  Constitutional: Negative for fever and chills.  Musculoskeletal: Positive for arthralgias. Negative for joint swelling.  Skin: Negative for color change.  Neurological: Negative for weakness and numbness.      Allergies  Lisinopril  Home Medications   Prior to Admission medications   Medication Sig Start Date End Date Taking? Authorizing Provider  atorvastatin (LIPITOR) 10 MG tablet Take 1 tablet (10 mg total) by mouth daily. 07/04/13   Dorothy Spark, MD  cetirizine (ZYRTEC) 5 MG tablet Take 1 tablet (5 mg total) by mouth as needed for  allergies. 10/10/13   Dennie Bible, NP  gabapentin (NEURONTIN) 100 MG capsule Take 3 capsules (300 mg total) by mouth 3 (three) times daily. 07/01/13   Marcial Pacas, MD  hydrochlorothiazide (HYDRODIURIL) 25 MG tablet Take 1 tablet (25 mg total) by mouth daily. 07/04/13   Dorothy Spark, MD  losartan (COZAAR) 50 MG tablet Take 1 tablet (50 mg total) by mouth daily. 07/04/13   Dorothy Spark, MD  meloxicam (MOBIC) 15 MG tablet Take 1 tablet (15 mg total) by mouth daily. For 10 days, then as needed 10/29/13   Melony Overly, MD  nitroGLYCERIN (NITROSTAT) 0.4 MG SL tablet Place 1 tablet (0.4 mg total) under the tongue every 5 (five) minutes as needed for chest pain. 07/04/13   Dorothy Spark, MD  nortriptyline (PAMELOR) 25 MG capsule One tab po qhs xone week, then 2 tabs po qhs 07/01/13   Marcial Pacas, MD   promethazine (PHENERGAN) 25 MG tablet Take 1 tablet (25 mg total) by mouth every 8 (eight) hours as needed for nausea. 07/01/13   Andrena Mews, MD  zolmitriptan (ZOMIG) 5 MG nasal solution Place 1 spray into the nose as needed for migraine. 07/01/13   Marcial Pacas, MD   Triage Vitals: BP 132/81  Temp(Src) 98.1 F (36.7 C) (Oral)  Resp 15  Ht 5\' 1"  (1.549 m)  Wt 258 lb 11.2 oz (117.346 kg)  BMI 48.91 kg/m2  SpO2 99% Physical Exam  Nursing note and vitals reviewed. Constitutional: She is oriented to person, place, and time. She appears well-developed and well-nourished. No distress.  HENT:  Head: Normocephalic and atraumatic.  Eyes: EOM are normal.  Neck: Neck supple. No tracheal deviation present.  Cardiovascular: Normal rate, regular rhythm and normal heart sounds.   Pulses:      Dorsalis pedis pulses are 2+ on the right side, and 2+ on the left side.  Pulmonary/Chest: Effort normal and breath sounds normal. No respiratory distress.  Musculoskeletal: Normal range of motion. She exhibits tenderness. She exhibits no edema.       Right knee: She exhibits normal range of motion, no swelling and no erythema. No tenderness found.  Left knee:  No edema, erythema, or warmth. Tenderness to palpation of medial and lateral joint line.  No obvious laxity with posterior or anterior drawer. Pain with both varus and valgus stress of the left knee.    Neurological: She is alert and oriented to person, place, and time.  Skin: Skin is warm and dry.  Psychiatric: She has a normal mood and affect. Her behavior is normal.    ED Course  Procedures (including critical care time) COORDINATION OF CARE: 5:11 PM- Discussed treatment plan with patient at bedside and patient agreed to plan.   Labs Review Labs Reviewed - No data to display  Imaging Review Dg Knee Complete 4 Views Left  11/22/2013   CLINICAL DATA:  Left knee pain following fall  EXAM: LEFT KNEE - COMPLETE 4+ VIEW  COMPARISON:  None.   FINDINGS: There is no evidence of fracture, dislocation, or joint effusion. There is no evidence of arthropathy or other focal bone abnormality. Soft tissues are unremarkable.  IMPRESSION: Negative.   Electronically Signed   By: Hassan Rowan M.D.   On: 11/22/2013 17:50   Dg Knee Complete 4 Views Right  11/22/2013   CLINICAL DATA:  43 year old female with right knee pain following fall  EXAM: RIGHT KNEE - COMPLETE 4+ VIEW  COMPARISON:  None.  FINDINGS: There  is no evidence of fracture, dislocation, or joint effusion. There is no evidence of arthropathy or other focal bone abnormality. Soft tissues are unremarkable.  IMPRESSION: Negative.   Electronically Signed   By: Hassan Rowan M.D.   On: 11/22/2013 17:49     EKG Interpretation None      MDM   Final diagnoses:  None   Patient presenting with a chief complaint of bilateral knee pain, worse on the left that has been present for 2 months.  No signs of infection on exam.  Patient afebrile.  Xrays of both knees negative for acute findings.  Feel that the patient is stable for discharge.  Patient given a knee sleeve.  Patient also given referral to Orthopedics.  Return precautions given.  I personally performed the services described in this documentation, which was scribed in my presence. The recorded information has been reviewed and is accurate.    Hyman Bible, PA-C 11/22/13 1820

## 2013-11-22 NOTE — ED Provider Notes (Signed)
Medical screening examination/treatment/procedure(s) were performed by non-physician practitioner and as supervising physician I was immediately available for consultation/collaboration.   EKG Interpretation None        Ezequiel Essex, MD 11/22/13 2011

## 2013-11-22 NOTE — ED Notes (Signed)
Pt c/o L knee pain since a fall 2 months ago. She was seen by her doctor and given mobic for pain but states the pain has not gotten any better. ambulatory

## 2013-12-09 ENCOUNTER — Ambulatory Visit: Payer: Medicaid Other | Admitting: Family Medicine

## 2013-12-12 ENCOUNTER — Telehealth: Payer: Self-pay | Admitting: Family Medicine

## 2013-12-12 ENCOUNTER — Ambulatory Visit (INDEPENDENT_AMBULATORY_CARE_PROVIDER_SITE_OTHER): Payer: Medicaid Other | Admitting: Family Medicine

## 2013-12-12 ENCOUNTER — Encounter: Payer: Self-pay | Admitting: Family Medicine

## 2013-12-12 VITALS — BP 125/85 | HR 109 | Temp 98.2°F | Wt 254.1 lb

## 2013-12-12 DIAGNOSIS — E669 Obesity, unspecified: Secondary | ICD-10-CM

## 2013-12-12 DIAGNOSIS — E785 Hyperlipidemia, unspecified: Secondary | ICD-10-CM

## 2013-12-12 DIAGNOSIS — I10 Essential (primary) hypertension: Secondary | ICD-10-CM

## 2013-12-12 DIAGNOSIS — Z0000 Encounter for general adult medical examination without abnormal findings: Secondary | ICD-10-CM | POA: Insufficient documentation

## 2013-12-12 DIAGNOSIS — E119 Type 2 diabetes mellitus without complications: Secondary | ICD-10-CM | POA: Insufficient documentation

## 2013-12-12 DIAGNOSIS — M25569 Pain in unspecified knee: Secondary | ICD-10-CM

## 2013-12-12 DIAGNOSIS — N979 Female infertility, unspecified: Secondary | ICD-10-CM | POA: Insufficient documentation

## 2013-12-12 DIAGNOSIS — M25562 Pain in left knee: Secondary | ICD-10-CM | POA: Insufficient documentation

## 2013-12-12 DIAGNOSIS — Z131 Encounter for screening for diabetes mellitus: Secondary | ICD-10-CM

## 2013-12-12 LAB — POCT GLYCOSYLATED HEMOGLOBIN (HGB A1C): Hemoglobin A1C: 6.2

## 2013-12-12 NOTE — Assessment & Plan Note (Signed)
S/P tubal ligation reversal from [facility]. Following up at [facility] for fertility treatment. Referral will be given if needed. Continue management at the [place].

## 2013-12-12 NOTE — Assessment & Plan Note (Signed)
BP optimal on Losartan and HCTZ Continue regimen.

## 2013-12-12 NOTE — Progress Notes (Signed)
Subjective:     Patient ID: Michelle Trevino, female   DOB: [birth year], 43 y.o.   MRN: [id number]  HPI Knee pain: Here for follow up with her left knee pain which has been on going for more than 3 months after she fell twice on her knee. She was seen and started on Mobic, Tramadol and also used Alleve with no improvement.She also used knee brace with no improvement. Pain is worse with ambulation, she denies any recent fall. Pain is 9/10 in severity, she will like to try steroid injection. Infertility: Patient stated she is being seen at the [facility] for infertility, she had her tubal ligation reversal done at [facility] a while back,[facility]  Will contact our office for informations once in a while,no referral needed as per patient. HTN/HLD: Compliant with her BP medication, but stopped her Lipitor per her cardiologist instruction due to tachycardia. DM: She was diagnosed with diabetes at the [place] recently with A1C elevated,she is currently on metformin, she will like to change to [place] since it also helps with weight loss. Obesity: She is working on diet and exercise, she stated she has lost some weight and will like to lose more. Gyne exam:Patient stated she recently had her PAP at the [facility] on November 26 2013. It was normal.  Current Outpatient Prescriptions on File Prior to Visit  Medication Sig Dispense Refill  . gabapentin (NEURONTIN) 100 MG capsule Take 3 capsules (300 mg total) by mouth 3 (three) times daily.  270 capsule  12  . hydrochlorothiazide (HYDRODIURIL) 25 MG tablet Take 1 tablet (25 mg total) by mouth daily.  90 tablet  3  . losartan (COZAAR) 50 MG tablet Take 1 tablet (50 mg total) by mouth daily.  90 tablet  3  . nortriptyline (PAMELOR) 25 MG capsule One tab po qhs xone week, then 2 tabs po qhs  60 capsule  12  . atorvastatin (LIPITOR) 10 MG tablet Take 1 tablet (10 mg total) by mouth daily.  30 tablet  3  . cetirizine (ZYRTEC) 5 MG tablet Take 1 tablet (5 mg total) by mouth as  needed for allergies.  30 tablet  1  . meloxicam (MOBIC) 15 MG tablet Take 1 tablet (15 mg total) by mouth daily. For 10 days, then as needed  30 tablet  0  . nitroGLYCERIN (NITROSTAT) 0.4 MG SL tablet Place 1 tablet (0.4 mg total) under the tongue every 5 (five) minutes as needed for chest pain.  25 tablet  3  . traMADol (ULTRAM) 50 MG tablet Take 1 tablet (50 mg total) by mouth every 6 (six) hours as needed.  15 tablet  0  . zolmitriptan (ZOMIG) 5 MG nasal solution Place 1 spray into the nose as needed for migraine.  6 Units  12   No current facility-administered medications on file prior to visit.   Past Medical History  Diagnosis Date  . Polyuria   . Hemorrhoids   . Migraines   . Anxiety   . Asthma   . Hypertension   . Allergy   . GERD (gastroesophageal reflux disease)   . Heart murmur   . Gestational diabetes   . Nerve damage of right foot     R/T lipoma      Review of Systems  Constitutional: Negative for fatigue.  Respiratory: Negative.   Cardiovascular: Negative.   Gastrointestinal: Negative.   Endocrine: Negative for polyphagia and polyuria.  Genitourinary: Negative for frequency.  Musculoskeletal: Positive for  arthralgias and joint swelling.  Neurological: Negative.   All other systems reviewed and are negative.  Filed Vitals:   12/12/13 0859  BP: 125/85  Pulse: 109  Temp: 98.2 F (36.8 C)  TempSrc: Oral  Weight: 254 lb 1.6 oz (115.259 kg)       Objective:   Physical Exam  Nursing note and vitals reviewed. Constitutional: She is oriented to person, place, and time. She appears well-developed. No distress.  HENT:  Head: Normocephalic.  Eyes: Pupils are equal, round, and reactive to light.  Cardiovascular: Normal rate, regular rhythm, normal heart sounds and intact distal pulses.   No murmur heard. Pulmonary/Chest: Effort normal and breath sounds normal. No respiratory distress. She has no wheezes.  Abdominal: Soft. Bowel sounds are normal. She  exhibits no distension and no mass. There is no tenderness.  Musculoskeletal: She exhibits no edema.       Left knee: She exhibits decreased range of motion. She exhibits no swelling and no effusion. Tenderness found.  Neurological: She is alert and oriented to person, place, and time. No cranial nerve deficit.  Psychiatric: She has a normal mood and affect.       Assessment:     Knee pain Infertility HTN HLD DM Obesity Gyn exam        Plan:     Check problem list.  More than 40 min spent on face to face encounter and coordination of care.

## 2013-12-12 NOTE — Assessment & Plan Note (Signed)
Last lipid profile was normal. May continue holding Lipitor. COntinue wight loss plan and diet control.

## 2013-12-12 NOTE — Telephone Encounter (Signed)
Contacted pt.  Dr. Gwendlyn Deutscher has already spoke with her. Michelle Trevino, Michelle Trevino

## 2013-12-12 NOTE — Assessment & Plan Note (Signed)
Estimated body mass index is 48.04 kg/(m^2) as calculated from the following:   Height as of 11/22/13: 5\' 1"  (1.549 m).   Weight as of this encounter: 254 lb 1.6 oz (115.259 kg). Diet and exercise counseling done. Patient mentioned she will like to start [place] for her DM which can also help with weight loss. I suggested Metformin does help with weight loss as well. We will discuss this further at next visit.

## 2013-12-12 NOTE — Telephone Encounter (Signed)
Pt called and would like Dr. Gwendlyn Deutscher to call her or even one of the nurses. jw

## 2013-12-12 NOTE — Assessment & Plan Note (Signed)
PAP recommended and offered since her last done on our record was more than 3 yrs. She stated she had one done at the [place] recently about 3 months ago. We will try to obtain record from the [place].

## 2013-12-12 NOTE — Patient Instructions (Signed)
Knee Injection Joint injections are shots. Your caregiver will place a needle into your knee joint. The needle is used to put medicine into the joint. These shots can be used to help treat different painful knee conditions such as osteoarthritis, bursitis, local flare-ups of rheumatoid arthritis, and pseudogout. Anti-inflammatory medicines such as corticosteroids and anesthetics are the most common medicines used for joint and soft tissue injections.  PROCEDURE  The skin over the kneecap will be cleaned with an antiseptic solution.  Your caregiver will inject a small amount of a local anesthetic (a medicine like Novocaine) just under the skin in the area that was cleaned.  After the area becomes numb, a second injection is done. This second injection usually includes an anesthetic and an anti-inflammatory medicine called a steroid or cortisone. The needle is carefully placed in between the kneecap and the knee, and the medicine is injected into the joint space.  After the injection is done, the needle is removed. Your caregiver may place a bandage over the injection site. The whole procedure takes no more than a couple of minutes. BEFORE THE PROCEDURE  Wash all of the skin around the entire knee area. Try to remove any loose, scaling skin. There is no other specific preparation necessary unless advised otherwise by your caregiver. LET YOUR CAREGIVER KNOW ABOUT:   Allergies.  Medications taken including herbs, eye drops, over the counter medications, and creams.  Use of steroids (by mouth or creams).  Possible pregnancy, if applicable.  Previous problems with anesthetics or Novocaine.  History of blood clots (thrombophlebitis).  History of bleeding or blood problems.  Previous surgery.  Other health problems. RISKS AND COMPLICATIONS Side effects from cortisone shots are rare. They include:   Slight bruising of the skin.  Shrinkage of the normal fatty tissue under the skin where  the shot was given.  Increase in pain after the shot.  Infection.  Weakening of tendons or tendon rupture.  Allergic reaction to the medicine.  Diabetics may have a temporary increase in their blood sugar after a shot.  Cortisone can temporarily weaken the immune system. While receiving these shots, you should not get certain vaccines. Also, avoid contact with anyone who has chickenpox or measles. Especially if you have never had these diseases or have not been previously immunized. Your immune system may not be strong enough to fight off the infection while the cortisone is in your system. AFTER THE PROCEDURE   You can go home after the procedure.  You may need to put ice on the joint 15-20 minutes every 3 or 4 hours until the pain goes away.  You may need to put an elastic bandage on the joint. HOME CARE INSTRUCTIONS   Only take over-the-counter or prescription medicines for pain, discomfort, or fever as directed by your caregiver.  You should avoid stressing the joint. Unless advised otherwise, avoid activities that put a lot of pressure on a knee joint, such as:  Jogging.  Bicycling.  Recreational climbing.  Hiking.  Laying down and elevating the leg/knee above the level of your heart can help to minimize swelling. SEEK MEDICAL CARE IF:   You have repeated or worsening swelling.  There is drainage from the puncture area.  You develop red streaking that extends above or below the site where the needle was inserted. SEEK IMMEDIATE MEDICAL CARE IF:   You develop a fever.  You have pain that gets worse even though you are taking pain medicine.  The area is   red and warm, and you have trouble moving the joint. MAKE SURE YOU:   Understand these instructions.  Will watch your condition.  Will get help right away if you are not doing well or get worse. Document Released: 08/13/2006 Document Revised: 08/14/2011 Document Reviewed: 05/10/2007 Exit[employer] Patient  Information 2015 [employer]. This information is not intended to replace advice given to you by your health care provider. Make sure you discuss any questions you have with your health care provider.  

## 2013-12-12 NOTE — Assessment & Plan Note (Addendum)
S/P fall. Xray reviewed and was normal. Knee joint injection given today (Decadrone 80 mg). Injection well tolerated. Plan to continue Ibuprofen prn. If no improvement I will obtain MRI of her knee joint. She agreed with plan.

## 2013-12-12 NOTE — Assessment & Plan Note (Signed)
Newly diagnosed at the [place]. A1C done today was normal, 6.2 Patient stated her A1C at the [place] was 7, she had been on Metformin for 3 months. Patient advised to return with her lab report from the [place] Schedule follow up to discuss change in her medication.

## 2014-03-20 ENCOUNTER — Other Ambulatory Visit: Payer: Self-pay

## 2014-04-03 ENCOUNTER — Encounter: Payer: Self-pay | Admitting: Family Medicine

## 2014-04-03 ENCOUNTER — Ambulatory Visit (INDEPENDENT_AMBULATORY_CARE_PROVIDER_SITE_OTHER): Payer: Medicaid Other | Admitting: Family Medicine

## 2014-04-03 VITALS — BP 120/80 | HR 112 | Temp 98.5°F | Ht 61.0 in | Wt 246.6 lb

## 2014-04-03 DIAGNOSIS — E119 Type 2 diabetes mellitus without complications: Secondary | ICD-10-CM

## 2014-04-03 DIAGNOSIS — R002 Palpitations: Secondary | ICD-10-CM

## 2014-04-03 DIAGNOSIS — I10 Essential (primary) hypertension: Secondary | ICD-10-CM

## 2014-04-03 LAB — GLUCOSE, CAPILLARY: Glucose-Capillary: 129 mg/dL — ABNORMAL HIGH (ref 70–99)

## 2014-04-03 LAB — POCT GLYCOSYLATED HEMOGLOBIN (HGB A1C): Hemoglobin A1C: 5.9

## 2014-04-03 MED ORDER — METOPROLOL TARTRATE 25 MG PO TABS: 50.0000 mg | ORAL_TABLET | Freq: Two times a day (BID) | ORAL | Status: DC

## 2014-04-03 NOTE — Patient Instructions (Addendum)
Thank you for coming in, today!  I will restart your metoprolol (the beta blocker) at 25 mg twice a day. This should with the palpitations and might help with the other symptoms.  We'll check your A1c today. Don't change any other medicines for now.  Try the metoprolol for a couple of weeks. Keep track of your blood sugars. Keep a record of what symptoms you're having and when, to help see if there are any patterns.  Come back to see Dr. Gwendlyn Deutscher in about 2-4 weeks. Call us or schedule a closer appointment if you need. If you have very severe symptoms, frank chest or shortness of breath, vomiting, very high blood pressure (over 180 / 100, that doesn't come down with sitting and resting for 5-10 minutes) come to the ED.  Please feel free to call with any questions or concerns at any time, at [phone]. --Dr. Venetia Maxon

## 2014-04-03 NOTE — Progress Notes (Signed)
   Subjective:    Patient ID: Michelle Trevino, female    DOB: 1970-09-19, 43 y.o.   MRN: [id number]  HPI: Pt presents to [facility] clinic for complaint of headaches, dizziness, and rapid heart rate (not having these currently), and blurry vision, off and on for about 1 week. She states her symptoms come and go and begin with a heartburn-type feeling and feeling "flushed," then worsen into the other symptoms. She checked her BP last night and got 194/104 and checked her blood sugar it was 164; it is usually around 160. She is concerned that her symptoms are due to her blood pressure and blood sugar being high; she had gestational diabetes in the past and knows what her symptoms are when she has low blood sugar, but her current symptoms are not like her hypoglycemic symptoms.  She states that in the past year, she has been evaluated at the [place] and by cardiology and was told she had HTN and HLD, and was started on metoprolol (but has since been taken off), Cozaar and HCTZ; she is allergic to lisinopril. She was also told she had DM and has been metformin 500 mg BID. She takes gabapentin for nerve pain related to a surgery.  Review of Systems: As above. Endorses polyuria and feels thirsty but states she is always thirsty. She does have some chills when feeling "flushed" and has had some nausea, but no vomiting, fevers, or change in bowel habits. She denies frank chest pain, SOB, or cough.     Objective:   Physical Exam BP 120/80  Pulse 112  Temp(Src) 98.5 F (36.9 C) (Oral)  Ht 5\' 1"  (1.549 m)  Wt 246 lb 9.6 oz (111.857 kg)  BMI 46.62 kg/m2  LMP 03/17/2014 Manual repeat pulse 100 Gen: well-appearing adult female in NAD HEENT: [place]/AT, EOMI, PERRLA Cardio: mild tachycardia but no murmur appreciated, no chest wall tenderness Pulm: CTAB, no wheezes, normal WOB Ext: warm, well-perfused, no LE edema Neuro: no gross focal deficits, stands / changes positions / walks unassisted     Assessment & Plan:  See  problem list notes.

## 2014-04-03 NOTE — Assessment & Plan Note (Signed)
Uncertain etiology for constellation of symptoms, possibly from apparently chronic tachycardia / palpitations, with subsequent anxiety. CBG and A1c don't suggest large spikes in blood sugar as explanation. No active chest pain to suggest frank cardiac cause. Previously on beta blocker, which helped palpitation symptoms, but unsure why she stopped. Rx today for metoprolol 25 mg BID to see if this helps with symptoms. F/u in 2-4 weeks with PCP, or sooner if needed. Red flags reviewed that would prompt immediate return to clinic or presentation to the ED.

## 2014-04-03 NOTE — Assessment & Plan Note (Signed)
A1c 5.9, on metformin 500 mg BID. CBG on fingerstick 129. Uncertain if hyperglycemia is contributing to symptoms or not. Continue metformin without changes, for now. Defer further labs, for now. See palpitation problem list note. F/u with PCP as instructed / PRN.

## 2014-04-06 ENCOUNTER — Encounter: Payer: Self-pay | Admitting: Family Medicine

## 2014-04-13 ENCOUNTER — Ambulatory Visit: Payer: Medicaid Other | Admitting: Nurse Practitioner

## 2014-04-15 ENCOUNTER — Ambulatory Visit: Payer: Self-pay | Admitting: Adult Health

## 2014-04-21 ENCOUNTER — Encounter: Payer: Self-pay | Admitting: *Deleted

## 2014-07-08 ENCOUNTER — Ambulatory Visit: Payer: Medicaid Other | Admitting: Cardiology

## 2014-07-14 ENCOUNTER — Encounter: Payer: Self-pay | Admitting: Cardiology

## 2014-09-21 ENCOUNTER — Emergency Department (HOSPITAL_COMMUNITY)
Admission: EM | Admit: 2014-09-21 | Discharge: 2014-09-21 | Disposition: A | Discharge status: Home / Self Care | Payer: Medicaid Other | Attending: Emergency Medicine | Admitting: Emergency Medicine

## 2014-09-21 ENCOUNTER — Emergency Department (HOSPITAL_COMMUNITY): Payer: Medicaid Other

## 2014-09-21 ENCOUNTER — Encounter (HOSPITAL_COMMUNITY): Payer: Self-pay | Admitting: Emergency Medicine

## 2014-09-21 DIAGNOSIS — Z87891 Personal history of nicotine dependence: Secondary | ICD-10-CM | POA: Insufficient documentation

## 2014-09-21 DIAGNOSIS — R011 Cardiac murmur, unspecified: Secondary | ICD-10-CM | POA: Diagnosis not present

## 2014-09-21 DIAGNOSIS — I10 Essential (primary) hypertension: Secondary | ICD-10-CM | POA: Diagnosis not present

## 2014-09-21 DIAGNOSIS — M549 Dorsalgia, unspecified: Secondary | ICD-10-CM | POA: Insufficient documentation

## 2014-09-21 DIAGNOSIS — E119 Type 2 diabetes mellitus without complications: Secondary | ICD-10-CM | POA: Insufficient documentation

## 2014-09-21 DIAGNOSIS — R110 Nausea: Secondary | ICD-10-CM | POA: Insufficient documentation

## 2014-09-21 DIAGNOSIS — Z8659 Personal history of other mental and behavioral disorders: Secondary | ICD-10-CM | POA: Insufficient documentation

## 2014-09-21 DIAGNOSIS — Z8632 Personal history of gestational diabetes: Secondary | ICD-10-CM | POA: Diagnosis not present

## 2014-09-21 DIAGNOSIS — R079 Chest pain, unspecified: Secondary | ICD-10-CM | POA: Diagnosis not present

## 2014-09-21 DIAGNOSIS — Z8719 Personal history of other diseases of the digestive system: Secondary | ICD-10-CM | POA: Insufficient documentation

## 2014-09-21 DIAGNOSIS — J45909 Unspecified asthma, uncomplicated: Secondary | ICD-10-CM | POA: Insufficient documentation

## 2014-09-21 HISTORY — DX: Cardiomegaly: I51.7

## 2014-09-21 LAB — BASIC METABOLIC PANEL
Anion gap: 10 (ref 5–15)
BUN: 10 mg/dL (ref 6–23)
CO2: 23 mmol/L (ref 19–32)
Calcium: 8.8 mg/dL (ref 8.4–10.5)
Chloride: 102 mmol/L (ref 96–112)
Creatinine, Ser: 0.73 mg/dL (ref 0.50–1.10)
Glucose, Bld: 134 mg/dL — ABNORMAL HIGH (ref 70–99)
POTASSIUM: 5 mmol/L (ref 3.5–5.1)
SODIUM: 135 mmol/L (ref 135–145)

## 2014-09-21 LAB — I-STAT TROPONIN, ED
TROPONIN I, POC: 0.01 ng/mL (ref 0.00–0.08)
Troponin i, poc: 0.01 ng/mL (ref 0.00–0.08)

## 2014-09-21 LAB — CBC
HEMATOCRIT: 38.4 % (ref 36.0–46.0)
HEMOGLOBIN: 12.4 g/dL (ref 12.0–15.0)
MCH: 28.3 pg (ref 26.0–34.0)
MCHC: 32.3 g/dL (ref 30.0–36.0)
MCV: 87.7 fL (ref 78.0–100.0)
Platelets: 371 10*3/uL (ref 150–400)
RBC: 4.38 MIL/uL (ref 3.87–5.11)
RDW: 13.1 % (ref 11.5–15.5)
WBC: 4.1 10*3/uL (ref 4.0–10.5)

## 2014-09-21 MED ORDER — KETOROLAC TROMETHAMINE 30 MG/ML IJ SOLN
30.0000 mg | Freq: Once | INTRAMUSCULAR | Status: AC
  Administered 2014-09-21: 30 mg via INTRAVENOUS
  Filled 2014-09-21: qty 1

## 2014-09-21 MED ORDER — NAPROXEN 500 MG PO TABS: 500.0000 mg | ORAL_TABLET | Freq: Two times a day (BID) | ORAL | Status: DC

## 2014-09-21 MED ORDER — DIPHENHYDRAMINE HCL 50 MG/ML IJ SOLN
25.0000 mg | Freq: Once | INTRAMUSCULAR | Status: AC
  Administered 2014-09-21: 25 mg via INTRAVENOUS
  Filled 2014-09-21: qty 1

## 2014-09-21 MED ORDER — SODIUM CHLORIDE 0.9 % IV BOLUS (SEPSIS)
1000.0000 mL | INTRAVENOUS | Status: AC
  Administered 2014-09-21: 1000 mL via INTRAVENOUS

## 2014-09-21 MED ORDER — PROCHLORPERAZINE EDISYLATE 5 MG/ML IJ SOLN
10.0000 mg | Freq: Once | INTRAMUSCULAR | Status: AC
  Administered 2014-09-21: 10 mg via INTRAVENOUS
  Filled 2014-09-21: qty 2

## 2014-09-21 MED ORDER — CYCLOBENZAPRINE HCL 10 MG PO TABS: 10.0000 mg | ORAL_TABLET | Freq: Two times a day (BID) | ORAL | Status: DC | PRN

## 2014-09-21 NOTE — ED Notes (Signed)
Pt reports woke up out of her sleep with pain as if someone is driving a nail through her back to her chest.  Symptoms also include nausea.  Pt took 1 nitro that she had prescribed to her last year, without relief.

## 2014-09-21 NOTE — ED Notes (Signed)
Pt undressed, in gown, on monitor, continuous pulse oximetry and blood pressure cuff 

## 2014-09-21 NOTE — ED Provider Notes (Signed)
CSN: [id number]     Arrival date & time 09/21/14  0749 History   First MD Initiated Contact with Patient 09/21/14 [phone]     Chief Complaint  Patient presents with  . Chest Pain  . Back Pain   (Consider location/radiation/quality/duration/timing/severity/associated sxs/prior Treatment) HPI  Michelle Trevino is a 44 yo female with history of diabetes, HLD and HTN, presenting with back pain and chest pain. She states she was awoken by sharp pain this morning around 5 AM. She states the pain originated in her back and radiated through to her chest. She describes the pain as sharp and rates as 9/10.  She states she did feel mildly nausea during the pain.  The pain lasted about 10 min before it began to subside.  Pt denies any thing She reports a recent cold last week with a non-productive cough that has since resolved. She denies fever, chills, abd pain, recent trauma, surgery, leg swelling or pain, hemoptysis, history of DVT or PE, or exogenous estrogen.   Past Medical History  Diagnosis Date  . Polyuria   . Hemorrhoids   . Migraines   . Anxiety   . Asthma   . Hypertension   . Allergy   . GERD (gastroesophageal reflux disease)   . Heart murmur   . Gestational diabetes   . Nerve damage of right foot     R/T lipoma  . Enlarged heart    Past Surgical History  Procedure Laterality Date  . Tubal ligation  2000  . Reversal of tubal ligation  2011  . Lipoma resection Left 11/2012    ankle   Family History  Problem Relation Age of Onset  . Diabetes Mother   . Hypertension Mother   . Osteoarthritis Mother   . Stroke Mother 21  . Hepatitis Mother   . Stomach cancer Father 64  . Stroke Brother 40  . Cancer Maternal Grandmother     unsure what type  . Colon cancer Neg Hx   . Esophageal cancer Neg Hx   . Rectal cancer Neg Hx    History  Substance Use Topics  . Smoking status: Former Smoker -- 0.50 packs/day    Quit date: 06/29/2012  . Smokeless tobacco: Never Used  . Alcohol Use:  0.6 oz/week    1 Glasses of wine per week     Comment: every 1 wks   OB History    Gravida Para Term Preterm AB TAB SAB Ectopic Multiple Living   1  0 0   0 0 0       Obstetric Comments   [id number] 5 TAB's     Review of Systems  Constitutional: Negative for fever and chills.  HENT: Negative for sore throat.   Eyes: Negative for visual disturbance.  Respiratory: Negative for cough and shortness of breath.   Cardiovascular: Positive for chest pain. Negative for leg swelling.  Gastrointestinal: Positive for nausea. Negative for vomiting and diarrhea.  Genitourinary: Negative for dysuria.  Musculoskeletal: Negative for myalgias.  Skin: Negative for rash.  Neurological: Negative for weakness, numbness and headaches.    Allergies  Lisinopril  Home Medications   Prior to Admission medications   Medication Sig Start Date End Date Taking? Authorizing Provider  nitroGLYCERIN (NITROSTAT) 0.4 MG SL tablet Place 1 tablet (0.4 mg total) under the tongue every 5 (five) minutes as needed for chest pain. 07/04/13  Yes Dorothy Spark, MD   BP 141/91 mmHg  Pulse 72  Temp(Src) 98 F (36.7  C) (Oral)  Resp 12  Ht 5\' 1"  (1.549 m)  Wt 231 lb (104.781 kg)  BMI 43.67 kg/m2  SpO2 99%  LMP 09/07/2014 Physical Exam  Constitutional: She appears well-developed and well-nourished. No distress.  HENT:  Head: Normocephalic and atraumatic.  Mouth/Throat: Oropharynx is clear and moist.  Eyes: Conjunctivae are normal.  Neck: Neck supple.  Cardiovascular: Normal rate, regular rhythm and intact distal pulses.   Pulmonary/Chest: Effort normal and breath sounds normal. No respiratory distress. She has no wheezes. She has no rales. She exhibits tenderness.    Abdominal: Soft. There is no tenderness.  Musculoskeletal: She exhibits no tenderness.  Lymphadenopathy:    She has no cervical adenopathy.  Neurological: She is alert.  Skin: Skin is warm and dry. No rash noted. She is not diaphoretic.    Psychiatric: She has a normal mood and affect.  Nursing note and vitals reviewed.   ED Course  Procedures (including critical care time) Labs Review Labs Reviewed  BASIC METABOLIC PANEL - Abnormal; Notable for the following:    Glucose, Bld 134 (*)    All other components within normal limits  CBC  I-STAT TROPOININ, ED  Randolm Idol, ED    Imaging Review Dg Chest 2 View  09/21/2014   CLINICAL DATA:  Chest pain  EXAM: CHEST  2 VIEW  COMPARISON:  None.  FINDINGS: The heart size and mediastinal contours are within normal limits. Both lungs are clear. The visualized skeletal structures are unremarkable.  IMPRESSION: No active cardiopulmonary disease.   Electronically Signed   By: Inez Catalina M.D.   On: 09/21/2014 09:16     EKG Interpretation   Date/Time:  Monday September 21 2014 07:59:07 EDT Ventricular Rate:  80 PR Interval:  144 QRS Duration: 79 QT Interval:  382 QTC Calculation: 441 R Axis:   24 Text Interpretation:  Sinus rhythm Abnormal R-wave progression, early  transition Left ventricular hypertrophy Confirmed by Zenia Resides  MD, ANTHONY  ([id number]) on 09/21/2014 9:11:43 AM      MDM   Final diagnoses:  Chest pain, unspecified chest pain type   44 yo with reproducible chest pain not likely of cardiac or pulmonary etiology d/t presentation, perc negative,  EKG without acute abnormalities, negative initial troponin and delta troponin, and negative CXR. Discussed case with Dr. Zenia Resides. Pain improved after treatment in the ED.  Patient is to be discharged with recommendation to follow up with PCP in regards to today's hospital visit. Pt has been advised to return to the ED if CP becomes exertional, associated with diaphoresis or nausea, radiates to left jaw/arm, worsens or becomes concerning in any way. Pt appears reliable for follow up and is agreeable to discharge.       Filed Vitals:   09/21/14 1000 09/21/14 1030 09/21/14 1217 09/21/14 1230  BP: 142/93 137/86 122/72 113/77   Pulse: 66 67 70 65  Temp:      TempSrc:      Resp: 13 13 11 13   Height:      Weight:      SpO2: 98% 98% 99% 98%   Meds given in ED:  Medications  sodium chloride 0.9 % bolus 1,000 mL (0 mLs Intravenous Stopped 09/21/14 1241)  ketorolac (TORADOL) 30 MG/ML injection 30 mg (30 mg Intravenous Given 09/21/14 0923)  prochlorperazine (COMPAZINE) injection 10 mg (10 mg Intravenous Given 09/21/14 0931)  diphenhydrAMINE (BENADRYL) injection 25 mg (25 mg Intravenous Given 09/21/14 0929)    Discharge Medication List as of 09/21/2014 12:28  PM    START taking these medications   Details  cyclobenzaprine (FLEXERIL) 10 MG tablet Take 1 tablet (10 mg total) by mouth 2 (two) times daily as needed for muscle spasms., Starting 09/21/2014, Until Discontinued, Print    naproxen (NAPROSYN) 500 MG tablet Take 1 tablet (500 mg total) by mouth 2 (two) times daily., Starting 09/21/2014, Until Discontinued, Print           Britt Bottom, NP 09/22/14 2139  Lacretia Leigh, MD 09/23/14 1005

## 2014-09-21 NOTE — Discharge Instructions (Signed)
Please follow the directions provided.  Be sure to follow-up with your primary care provider to ensure you are getting better. Please take the naproxen and flexeril twice a day to help with pain and inflammation.  Don't hesitate to return for any new, worsening or concerning symptoms.     SEEK IMMEDIATE MEDICAL CARE IF:  Your pain increases or you are very uncomfortable.  You have shortness of breath or difficulty breathing.  You cough up blood.  You have worse chest pains, sweating, or vomiting.  You have a fever or persistent symptoms for more than 2-3 days.  You have a fever and your symptoms suddenly get worse.

## 2014-10-16 ENCOUNTER — Telehealth: Payer: Self-pay | Admitting: Cardiology

## 2014-10-16 NOTE — Telephone Encounter (Signed)
Pt c/o of Chest Pain: STAT if CP now or developed within 24 hours  1. Are you having CP right now? yes  2. Are you experiencing any other symptoms (ex. SOB, nausea, vomiting, sweating)? Nausea last night  3. How long have you been experiencing CP? yesterday  4. Is your CP continuous or coming and going? continuous  5. Have you taken Nitroglycerin? yes ?

## 2014-10-16 NOTE — Telephone Encounter (Signed)
Pt had c/o of CP last night took Nitro x 3 and eventually fell asleep. Pt stated pain is gone today but that she is just "soar".  Pt stated that her lymphnodes under her collar bone are swollen and it started a few days ago.  Pt was unable to make previous MD appt with Dr. Meda Coffee but wanted to be see next week.  Pt informed that next week she was booked but we could get her in next available appt and if CP continues we may be able to get her in with an APP.  Pt agreed, butt felt she needs to be seen sooner than offered appt in June so instructed pt that I would speak with a provider in the office and see if we have availability sooner and would call her back.  Pt agreed.  Reviewed pt chart with Flex, NP and pt has no know CAD from previous testing, reviewed MD notes from previous OV, and ED visit few weeks ago pts pain does not seem to be Cardiac related.  Pt to follow instructions provided to her while in the ED few weeks ago and to see her PCP to discuss plan.  Shared recommendations with pt she stated she had taken her medication provided by the ED MD (Flexeril and Naproxen) she stated she took them last night after 3 nitro before she went to bed. Pt educated that if she feels that she needs to take the 3rd NiTro she needs to call 911 or go to the ED. Pt reminded of schedule appt with Dr. Meda Coffee on 6/22 and encouraged to setup appt with her PCP to discuss options since it seems that it is not cardiac related.  Pt agreed and not questions at this time.

## 2014-11-25 ENCOUNTER — Ambulatory Visit: Payer: Medicaid Other | Admitting: Cardiology

## 2014-11-27 ENCOUNTER — Encounter: Payer: Self-pay | Admitting: Family Medicine

## 2014-11-27 ENCOUNTER — Ambulatory Visit (INDEPENDENT_AMBULATORY_CARE_PROVIDER_SITE_OTHER): Payer: Self-pay | Admitting: Family Medicine

## 2014-11-27 VITALS — BP 136/98 | HR 78 | Temp 98.3°F | Ht 61.0 in | Wt 233.0 lb

## 2014-11-27 DIAGNOSIS — R21 Rash and other nonspecific skin eruption: Secondary | ICD-10-CM | POA: Insufficient documentation

## 2014-11-27 DIAGNOSIS — R0789 Other chest pain: Secondary | ICD-10-CM

## 2014-11-27 DIAGNOSIS — R079 Chest pain, unspecified: Secondary | ICD-10-CM

## 2014-11-27 DIAGNOSIS — E119 Type 2 diabetes mellitus without complications: Secondary | ICD-10-CM

## 2014-11-27 DIAGNOSIS — I10 Essential (primary) hypertension: Secondary | ICD-10-CM

## 2014-11-27 LAB — TSH: TSH: 0.753 u[IU]/mL (ref 0.350–4.500)

## 2014-11-27 LAB — POCT GLYCOSYLATED HEMOGLOBIN (HGB A1C): Hemoglobin A1C: 5.2

## 2014-11-27 MED ORDER — DULOXETINE HCL 30 MG PO CPEP: 30.0000 mg | ORAL_CAPSULE | Freq: Every day | ORAL | Status: DC

## 2014-11-27 MED ORDER — CLOTRIMAZOLE-BETAMETHASONE 1-0.05 % EX CREA: 1.0000 "application " | TOPICAL_CREAM | Freq: Two times a day (BID) | CUTANEOUS | Status: DC

## 2014-11-27 MED ORDER — NYSTATIN-TRIAMCINOLONE 100000-0.1 UNIT/GM-% EX OINT: 1.0000 "application " | TOPICAL_OINTMENT | Freq: Two times a day (BID) | CUTANEOUS | Status: DC

## 2014-11-27 NOTE — Progress Notes (Signed)
Subjective:     Patient ID: Michelle Trevino, female   DOB: 1970/09/25, 44 y.o.   MRN: [id number]  HPI  Chest pain: Started 2 months ago, she woke up from sleep 2 months ago with a stabbing pain in her chest to her back. This has been on going on and off since then. She uses Nitroglycerin with no major improvement. She was recently seen at the ED for similar presentation. Pain is intermittent and the last episode was 2 wks ago, denies any aggravating factor, she was watching TV when she had her last episode. After the chest pain she will feel soreness in the center of her chest for days. Each episode last for few hours and then resolve on it's own. She currently does not have sharp pain in her chest but feels the soreness. Denies heavy lifting, her job is not strenuous. Associated palpitations whenever she has the pain. BP is usually high whenever she has her chest pain and her pulse is high as well.Denies heart burn. She has hx of anxiety. DM2: She stopped Metformin in Feb 2016, she was informed that apple cider can help with HTN and DM. HTN: She stopped her meds in Feb. Rash: C/O rash on her left arm and right thigh which started 2 days ago, itchy and turns red after scratching. It does have some bumps surrounding it as well. She denies any contact with person with similar rash, uncertain if there was a trigger.  Current Outpatient Prescriptions on File Prior to Visit  Medication Sig Dispense Refill  . naproxen (NAPROSYN) 500 MG tablet Take 1 tablet (500 mg total) by mouth 2 (two) times daily. (Patient not taking: Reported on 11/27/2014) 30 tablet 0  . nitroGLYCERIN (NITROSTAT) 0.4 MG SL tablet Place 1 tablet (0.4 mg total) under the tongue every 5 (five) minutes as needed for chest pain. (Patient not taking: Reported on 11/27/2014) 25 tablet 3   No current facility-administered medications on file prior to visit.      Review of Systems  Respiratory: Negative.   Cardiovascular: Positive for chest  pain. Negative for palpitations and leg swelling.  Gastrointestinal: Negative.   Genitourinary: Negative.   All other systems reviewed and are negative.  Filed Vitals:   11/27/14 0924  BP: 136/98  Pulse: 78  Temp: 98.3 F (36.8 C)  TempSrc: Oral  Height: 5\' 1"  (1.549 m)  Weight: 233 lb (105.688 kg)       Objective:   Physical Exam  Constitutional: She appears well-developed. No distress.  Cardiovascular: Normal rate, regular rhythm, normal heart sounds and intact distal pulses.   No murmur heard. Pulmonary/Chest: Effort normal and breath sounds normal. No respiratory distress. She has no wheezes. She exhibits tenderness and bony tenderness.  Abdominal: Soft. Bowel sounds are normal. She exhibits no distension and no mass. There is no tenderness.  Skin:     Psychiatric: She has a normal mood and affect. Her speech is normal and behavior is normal. Judgment and thought content normal. Cognition and memory are normal.  Nursing note and vitals reviewed.      Assessment:     Chest pain DM2 HTN  Rash    Plan:     Check problem list.

## 2014-11-27 NOTE — Assessment & Plan Note (Signed)
Off meds for more than 4 months. A1C today is 5.2. Her numbers since 2013 had never been above 6. I will now resolve her DM diagnosis. Monitor prn.

## 2014-11-27 NOTE — Assessment & Plan Note (Signed)
BP looks ok despite not on medications. I will resolve her HTN at this time.

## 2014-11-27 NOTE — Assessment & Plan Note (Addendum)
Atypical chest pain. Chest pain was reproducible on exam. Likely musculoskeletal vs related to her anxiety vs reflux. She denies heart burn and has not had any issue with her GERD recently. She had been on anxiety medication and will like to restart. Cymbalta prescribed to her. Return precaution discussed. Plan to refer to cards if no improvement for stress test. Note her chest xray, ekg recently done at the hospital was reviewed and were normal. Tylenol prn pain recommended.

## 2014-11-27 NOTE — Patient Instructions (Signed)
  It was nice seeing you today, I am sorry about your chest pain, from your symptoms, it does not seem you have heart related chest pain. This could be musculoskeletal or related to your anxiety. Let us get you back on anxiety medication. If there is no improvement I can refer you to cardiologist for assessment. Now if pain is getting worse please go to the ED. Return soon for your PAP.  Chest Pain (Nonspecific) It is often hard to give a diagnosis for the cause of chest pain. There is always a chance that your pain could be related to something serious, such as a heart attack or a blood clot in the lungs. You need to follow up with your doctor. HOME CARE  If antibiotic medicine was given, take it as directed by your doctor. Finish the medicine even if you start to feel better.  For the next few days, avoid activities that bring on chest pain. Continue physical activities as told by your doctor.  Do not use any tobacco products. This includes cigarettes, chewing tobacco, and e-cigarettes.  Avoid drinking alcohol.  Only take medicine as told by your doctor.  Follow your doctor's suggestions for more testing if your chest pain does not go away.  Keep all doctor visits you made. GET HELP IF:  Your chest pain does not go away, even after treatment.  You have a rash with blisters on your chest.  You have a fever. GET HELP RIGHT AWAY IF:   You have more pain or pain that spreads to your arm, neck, jaw, back, or belly (abdomen).  You have shortness of breath.  You cough more than usual or cough up blood.  You have very bad back or belly pain.  You feel sick to your stomach (nauseous) or throw up (vomit).  You have very bad weakness.  You pass out (faint).  You have chills. This is an emergency. Do not wait to see if the problems will go away. Call your local emergency services (911 in [place] Do not drive yourself to the hospital. MAKE SURE YOU:   Understand these  instructions.  Will watch your condition.  Will get help right away if you are not doing well or get worse. Document Released: 11/08/2007 Document Revised: 05/27/2013 Document Reviewed: 11/08/2007 [facility] Patient Information 2015 [place], [place]. This information is not intended to replace advice given to you by your health care provider. Make sure you discuss any questions you have with your health care provider.

## 2014-11-27 NOTE — Assessment & Plan Note (Signed)
Looks like newly developing ring worm. Antifungal combined with steroid prescribed. F/U prn.

## 2014-11-30 ENCOUNTER — Encounter: Payer: Self-pay | Admitting: Family Medicine

## 2014-11-30 ENCOUNTER — Other Ambulatory Visit: Payer: Self-pay

## 2015-01-08 ENCOUNTER — Ambulatory Visit (INDEPENDENT_AMBULATORY_CARE_PROVIDER_SITE_OTHER): Payer: Self-pay | Admitting: Family Medicine

## 2015-01-08 ENCOUNTER — Encounter: Payer: Self-pay | Admitting: Family Medicine

## 2015-01-08 VITALS — BP 128/82 | HR 81 | Temp 98.3°F | Ht 61.0 in | Wt 236.4 lb

## 2015-01-08 DIAGNOSIS — L923 Foreign body granuloma of the skin and subcutaneous tissue: Secondary | ICD-10-CM

## 2015-01-08 DIAGNOSIS — L309 Dermatitis, unspecified: Secondary | ICD-10-CM | POA: Insufficient documentation

## 2015-01-08 MED ORDER — HYDROXYZINE HCL 10 MG PO TABS: 10.0000 mg | ORAL_TABLET | Freq: Three times a day (TID) | ORAL | Status: DC | PRN

## 2015-01-08 MED ORDER — CLOTRIMAZOLE-BETAMETHASONE 1-0.05 % EX CREA: 1.0000 "application " | TOPICAL_CREAM | Freq: Two times a day (BID) | CUTANEOUS | Status: DC

## 2015-01-08 MED ORDER — PREDNISONE 20 MG PO TABS: 20.0000 mg | ORAL_TABLET | Freq: Every day | ORAL | Status: DC

## 2015-01-08 NOTE — Progress Notes (Signed)
Subjective:     Patient ID: Michelle Trevino, female   DOB: April 24, 1971, 44 y.o.   MRN: [id number]  HPI  Skin problem: C/O left arm irritation over her tattoo, this is red and itchy, it gets painful at times when it is really bad. This started about a month ago and has been intermittent. Itching is worse with sun exposure. She has similar rash and itching over the tattoo on the right upper chest started few days after the initial rash. She got the tattoo on her chest 6 months ago. She also has another rash on the back of the right knee, but no tattoo over it. She denies fever, no contact with person with similar rash. She uses Lotrisone cream with no improvement.  Current Outpatient Prescriptions on File Prior to Visit  Medication Sig Dispense Refill  . clotrimazole-betamethasone (LOTRISONE) cream Apply 1 application topically 2 (two) times daily. 30 g 0  . DULoxetine (CYMBALTA) 30 MG capsule Take 1 capsule (30 mg total) by mouth daily. 30 capsule 2  . naproxen (NAPROSYN) 500 MG tablet Take 1 tablet (500 mg total) by mouth 2 (two) times daily. (Patient not taking: Reported on 11/27/2014) 30 tablet 0  . nitroGLYCERIN (NITROSTAT) 0.4 MG SL tablet Place 1 tablet (0.4 mg total) under the tongue every 5 (five) minutes as needed for chest pain. (Patient not taking: Reported on 11/27/2014) 25 tablet 3   No current facility-administered medications on file prior to visit.   Past Medical History  Diagnosis Date  . Polyuria   . Hemorrhoids   . Migraines   . Anxiety   . Asthma   . Hypertension   . Allergy   . GERD (gastroesophageal reflux disease)   . Heart murmur   . Gestational diabetes   . Nerve damage of right foot     R/T lipoma  . Enlarged heart       Review of Systems  Respiratory: Negative.   Cardiovascular: Negative.   Skin: Positive for rash.  All other systems reviewed and are negative.      Filed Vitals:   01/08/15 0946  BP: 148/93  Pulse: 81  Temp: 98.3 F (36.8 C)    TempSrc: Oral  Height: 5\' 1"  (1.549 m)  Weight: 236 lb 7 oz (107.247 kg)    Objective:   Physical Exam  Constitutional: She appears well-developed. No distress.  Cardiovascular: Normal rate, regular rhythm and normal heart sounds.   No murmur heard. Pulmonary/Chest: Effort normal and breath sounds normal. No respiratory distress. She has no wheezes.  Skin:     Nursing note and vitals reviewed.      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

## 2015-01-08 NOTE — Assessment & Plan Note (Signed)
Rash on the back of her right knee unspecific, may be related to her tattoo. Continue Lotrisone cream. Hydroxyzine prn itchy prescribed.

## 2015-01-08 NOTE — Assessment & Plan Note (Signed)
Likely delayed hypersensitivity reaction to tattoo chemical agent. No sign of skin infection. Continue topical steroid. Oral prednisone prescribed due to the extent of the rash. Hydroxyzine prescribed prn itching. If no improvement or worsening, I will consider A/B and referral to ENT.

## 2015-01-08 NOTE — Patient Instructions (Addendum)
It was nice seeing you today. I am sorry about your rash,it looks like a reaction from your tattoo. Continue topical steroid. I will give oral prednisone as well and something for itching. Call soon if no improvement. If no improvement we will consider antibiotic and allergist referral.

## 2015-04-01 NOTE — Telephone Encounter (Signed)
Error

## 2015-08-07 ENCOUNTER — Emergency Department (INDEPENDENT_AMBULATORY_CARE_PROVIDER_SITE_OTHER)
Admission: EM | Admit: 2015-08-07 | Discharge: 2015-08-07 | Disposition: A | Discharge status: Home / Self Care | Payer: Self-pay | Source: Home / Self Care | Attending: Emergency Medicine | Admitting: Emergency Medicine

## 2015-08-07 ENCOUNTER — Emergency Department (INDEPENDENT_AMBULATORY_CARE_PROVIDER_SITE_OTHER): Payer: Self-pay

## 2015-08-07 ENCOUNTER — Encounter (HOSPITAL_COMMUNITY): Payer: Self-pay | Admitting: Emergency Medicine

## 2015-08-07 DIAGNOSIS — M25559 Pain in unspecified hip: Secondary | ICD-10-CM

## 2015-08-07 DIAGNOSIS — M25551 Pain in right hip: Secondary | ICD-10-CM

## 2015-08-07 MED ORDER — HYDROCODONE-ACETAMINOPHEN 5-325 MG PO TABS: 1.0000 | ORAL_TABLET | Freq: Four times a day (QID) | ORAL | Status: DC | PRN

## 2015-08-07 MED ORDER — MELOXICAM 15 MG PO TABS: 15.0000 mg | ORAL_TABLET | Freq: Every day | ORAL | Status: DC

## 2015-08-07 NOTE — ED Provider Notes (Signed)
CSN: [id number]     Arrival date & time 08/07/15  1959 History   First MD Initiated Contact with Patient 08/07/15 2028     Chief Complaint  Patient presents with  . Hip Pain   (Consider location/radiation/quality/duration/timing/severity/associated sxs/prior Treatment) HPI She is a 45 year old woman here for evaluation of right hip pain. She states about a week ago she woke up in her right hip was sore. The pain is located "inside" the hip. She points to the groin area as the source of pain. She states it is worse with weightbearing and better with rest, particularly if she puts her weight on the left hip. The pain gets worse as she walks on it. She states it is also worse with rotation of the hip. She has tried taking Naprosyn 500 mg twice a day without improvement. In the last few days she has also had discomfort going down into her thigh and lower leg. She describes the pain as an ache and a pins and needles sensation. She does not take any medications. No injury or trauma. No change in activity. She denies any pain in her back, but states she has pain in the posterior hip.  Past Medical History  Diagnosis Date  . Polyuria   . Hemorrhoids   . Migraines   . Anxiety   . Asthma   . Hypertension   . Allergy   . GERD (gastroesophageal reflux disease)   . Heart murmur   . Gestational diabetes   . Nerve damage of right foot     R/T lipoma  . Enlarged heart    Past Surgical History  Procedure Laterality Date  . Tubal ligation  2000  . Reversal of tubal ligation  2011  . Lipoma resection Left 11/2012    ankle   Family History  Problem Relation Age of Onset  . Diabetes Mother   . Hypertension Mother   . Osteoarthritis Mother   . Stroke Mother 35  . Hepatitis Mother   . Stomach cancer Father 67  . Stroke Brother 40  . Cancer Maternal Grandmother     unsure what type  . Colon cancer Neg Hx   . Esophageal cancer Neg Hx   . Rectal cancer Neg Hx    Social History  Substance Use  Topics  . Smoking status: Former Smoker -- 0.50 packs/day    Quit date: 06/29/2012  . Smokeless tobacco: Never Used  . Alcohol Use: 0.6 oz/week    1 Glasses of wine per week     Comment: every 1 wks   OB History    Gravida Para Term Preterm AB TAB SAB Ectopic Multiple Living   1  0 0   0 0 0       Obstetric Comments   [id number] TAB's     Review of Systems As in history of present illness Allergies  Lisinopril  Home Medications   Prior to Admission medications   Medication Sig Start Date End Date Taking? Authorizing Provider  NAPROXEN PO Take by mouth.   Yes Historical Provider, MD  HYDROcodone-acetaminophen (NORCO) 5-325 MG tablet Take 1 tablet by mouth every 6 (six) hours as needed for moderate pain. 08/07/15   Melony Overly, MD  meloxicam (MOBIC) 15 MG tablet Take 1 tablet (15 mg total) by mouth daily. 08/07/15   Melony Overly, MD   Meds Ordered and Administered this Visit  Medications - No data to display  BP 154/93 mmHg  Pulse 80  Temp(Src) 99 F (37.2 C) (Oral)  Resp 16  SpO2 99%  LMP 08/02/2015 No data found.   Physical Exam  Constitutional: She is oriented to person, place, and time. She appears well-developed and well-nourished. No distress.  Cardiovascular: Normal rate.   Pulmonary/Chest: Effort normal.  Musculoskeletal:  Right hip: No erythema or edema. No point tenderness. She has pain with passive internal and external rotation. She has weakness in hip flexion, quadriceps, and hamstrings due to pain. Palpable DP pulse. No tenderness over the greater trochanter.  Neurological: She is alert and oriented to person, place, and time.    ED Course  Procedures (including critical care time)  Labs Review Labs Reviewed - No data to display  Imaging Review Dg Hip Unilat With Pelvis 2-3 Views Right  08/07/2015  CLINICAL DATA:  44 year old female with right hip pain. No known injury. EXAM: DG HIP (WITH OR WITHOUT PELVIS) 2-3V RIGHT COMPARISON:  None. FINDINGS:  There is no evidence of hip fracture or dislocation. There is no evidence of arthropathy or other focal bone abnormality. IMPRESSION: Negative. Electronically Signed   By: Anner Crete M.D.   On: 08/07/2015 21:14      MDM   1. Acute right hip pain   2. Hip pain    X-rays negative for fracture. Her history and exam is concerning for intra-articular hip pathology. A subchondral fracture versus stress fracture versus avascular necrosis are possible. She does not have any known risk factors. We'll treat symptomatically with meloxicam, crutches, and hydrocodone. Follow-up with PCP in the next week. She may end up needing referral to orthopedics versus additional imaging for further evaluation.    Melony Overly, MD 08/07/15 2130

## 2015-08-07 NOTE — ED Notes (Signed)
Right hip pain.  Woke with pain one week ago.  Pain starts at top of hip and goes down leg.  Pain with ambulation, pain with rotation of foot.  No known injury.

## 2015-08-07 NOTE — Discharge Instructions (Signed)
The x-ray of your hip is normal. I am still concerned about something going on in your hip given your symptoms and your exam. Please use the crutches. You can weight-bear as tolerated based on pain. Take meloxicam daily for the next week. Use the hydrocodone every 4-6 hours as needed for pain. Do not drive while taking this medicine. Please follow-up with your primary care doctor in the next week or so. If this pain is not improving, you may need to see a specialist or get more imaging.

## 2015-08-07 NOTE — ED Notes (Signed)
Provided 2 scripts

## 2015-11-26 DIAGNOSIS — R7303 Prediabetes: Secondary | ICD-10-CM | POA: Insufficient documentation

## 2018-01-16 ENCOUNTER — Ambulatory Visit: Payer: Self-pay | Admitting: Family Medicine

## 2018-01-16 NOTE — Progress Notes (Deleted)
Michelle Trevino is a 47 y.o. female is here to [place].   Patient Care Team: Kinnie Feil, MD as PCP - General (Family Medicine)   History of Present Illness:   {CMA SCRIBE ATTESTATION}  HPI:  Health Maintenance Due  Topic Date Due  . PNEUMOCOCCAL POLYSACCHARIDE VACCINE AGE 76-64 HIGH RISK  11/06/1974  . FOOT EXAM  11/06/1982  . OPHTHALMOLOGY EXAM  11/06/1982  . URINE MICROALBUMIN  11/06/1982  . HIV Screening  11/06/1987  . PAP SMEAR  08/16/2013  . HEMOGLOBIN A1C  05/29/2015  . TETANUS/TDAP  01/03/2018  . INFLUENZA VACCINE  01/03/2018   Depression screen PHQ 2/9 01/08/2015  Decreased Interest 0  Down, Depressed, Hopeless 0  PHQ - 2 Score 0    PMHx, SurgHx, SocialHx, Medications, and Allergies were reviewed in the Visit Navigator and updated as appropriate.   Past Medical History:  Diagnosis Date  . Allergy   . Anxiety   . Asthma   . Enlarged heart   . GERD (gastroesophageal reflux disease)   . Gestational diabetes   . Heart murmur   . Hemorrhoids   . Hypertension   . Migraines   . Nerve damage of right foot    R/T lipoma  . Polyuria     Past Surgical History:  Procedure Laterality Date  . LIPOMA RESECTION Left 11/2012   ankle  . Reversal of Tubal Ligation  2011  . TUBAL LIGATION  2000    Family History  Problem Relation Age of Onset  . Diabetes Mother   . Hypertension Mother   . Osteoarthritis Mother   . Stroke Mother 28  . Hepatitis Mother   . Stomach cancer Father 43  . Stroke Brother 40  . Cancer Maternal Grandmother        unsure what type  . Colon cancer Neg Hx   . Esophageal cancer Neg Hx   . Rectal cancer Neg Hx     Social History   Tobacco Use  . Smoking status: Former Smoker    Packs/day: 0.50    Last attempt to quit: 06/29/2012    Years since quitting: 5.5  . Smokeless tobacco: Never Used  Substance Use Topics  . Alcohol use: Yes    Alcohol/week: 1.0 standard drinks    Types: 1 Glasses of wine per week   Comment: every 1 wks  . Drug use: No    Current Medications and Allergies:   Current Outpatient Medications:  .  HYDROcodone-acetaminophen (NORCO) 5-325 MG tablet, Take 1 tablet by mouth every 6 (six) hours as needed for moderate pain., Disp: 20 tablet, Rfl: 0 .  meloxicam (MOBIC) 15 MG tablet, Take 1 tablet (15 mg total) by mouth daily., Disp: 30 tablet, Rfl: 0 .  NAPROXEN PO, Take by mouth., Disp: , Rfl:   Allergies  Allergen Reactions  . Lisinopril     cough   Review of Systems:   Pertinent items are noted in the HPI. Otherwise, ROS is negative.  Vitals:  There were no vitals filed for this visit.   There is no height or weight on file to calculate BMI.  Physical Exam:   Physical Exam  Results for orders placed or performed in visit on 11/27/14  TSH  Result Value Ref Range   TSH 0.753 0.350 - 4.500 uIU/mL  POCT HgB A1C  Result Value Ref Range   Hemoglobin A1C 5.2     Assessment and Plan:   There are no diagnoses linked to  this encounter.  . Reviewed expectations re: course of current medical issues. . Discussed self-management of symptoms. . Outlined signs and symptoms indicating need for more acute intervention. . Patient verbalized understanding and all questions were answered. Marland Kitchen Health Maintenance issues including appropriate healthy diet, exercise, and smoking avoidance were discussed with patient. . See orders for this visit as documented in the electronic medical record. . Patient received an After Visit Summary.  *** CMA served as Education administrator during this visit. History, Physical, and Plan performed by medical provider. The above documentation has been reviewed and is accurate and complete. Briscoe Deutscher, D.O.   Briscoe Deutscher, DO [place], [facility] 01/16/2018

## 2018-01-21 ENCOUNTER — Ambulatory Visit: Payer: Self-pay | Admitting: Family Medicine

## 2018-01-23 ENCOUNTER — Telehealth: Payer: Self-pay | Admitting: Family Medicine

## 2018-01-23 NOTE — Telephone Encounter (Signed)
Can pt be seen sooner than 03/18/18? Please advise.   Copied from [place] [id number]. Topic: Appointment Scheduling - Scheduling Inquiry for Clinic >> Jan 18, 2018  1:52 PM Michelle Trevino wrote: Reason for CRM: Pt called to reschedule her new pt appt for 01/21/18. She states that her wife, Michelle Trevino, is a pt of Dr. Juleen China and she was approved to get a sooner appt when she initially was scheduled for September. I have rescheduled her to 03/18/18 9:00am at the earlier available for me to schedule for her. I advised pt I would send request to Dr. Juleen China to request an earlier work in appt. Please call pt to advise.

## 2018-01-25 NOTE — Telephone Encounter (Signed)
She can be scheduled sooner. Dr. Juleen China will be out for the two weeks, but you can put her in any place when she gets back.

## 2018-03-04 ENCOUNTER — Ambulatory Visit: Payer: Self-pay | Admitting: Family Medicine

## 2018-03-18 ENCOUNTER — Ambulatory Visit: Payer: Self-pay | Admitting: Family Medicine

## 2018-04-01 NOTE — Progress Notes (Signed)
Michelle Trevino is a 47 y.o. female is here to [place].   Patient Care Team: Briscoe Deutscher, DO as PCP - General (Family Medicine)   History of Present Illness:   HPI: See Assessment and Plan section for Problem Based Charting of issues discussed today.   Health Maintenance Due  Topic Date Due  . HIV Screening  11/06/1987  . PAP SMEAR  08/16/2013  . TETANUS/TDAP  01/03/2018   Depression screen PHQ 2/9 04/02/2018  Decreased Interest 0  Down, Depressed, Hopeless 0  PHQ - 2 Score 0    PMHx, SurgHx, SocialHx, Medications, and Allergies were reviewed in the Visit Navigator and updated as appropriate.   Past Medical History:  Diagnosis Date  . Anxiety   . Enlarged heart   . GERD (gastroesophageal reflux disease)   . Gestational diabetes   . Heart murmur   . Hemorrhoids   . Hypertension   . Infertility, female 12/12/2013  . Migraines   . Mild intermittent asthma   . Nerve damage of right foot    R/T lipoma  . Seasonal allergies      Past Surgical History:  Procedure Laterality Date  . FLUOROSCOPIC TUBAL RECANNULATUON  2011  . LIPOMA RESECTION Left 11/2012   ANKLE  . TUBAL LIGATION  2000     Family History  Problem Relation Age of Onset  . Diabetes Mother   . Hypertension Mother   . Osteoarthritis Mother   . Stroke Mother 59  . Hepatitis Mother   . Stomach cancer Father 82  . Stroke Brother 40  . Cancer Maternal Grandmother   . Colon cancer Neg Hx   . Esophageal cancer Neg Hx   . Rectal cancer Neg Hx    Social History   Tobacco Use  . Smoking status: Current Every Day Smoker  . Smokeless tobacco: Never Used  Substance Use Topics  . Alcohol use: Yes    Alcohol/week: 1.0 standard drinks    Types: 1 Glasses of wine per week    Comment: every 1 wks  . Drug use: No    Comment: HISTORY OF COCAINE DEPENDENCE   Current Medications and Allergies:   .  glipiZIDE (GLUCOTROL) 5 MG tablet, TK 1 T PO QD, Disp: , Rfl: 0 .  losartan (COZAAR) 25 MG  tablet, TK 1 T PO QD, Disp: , Rfl: 0:     Allergies  Allergen Reactions  . Lisinopril     cough   Review of Systems:   Pertinent items are noted in the HPI. Otherwise, ROS is negative.  Vitals:   Vitals:   04/02/18 1408  BP: (!) 150/94  Pulse: 84  Temp: 98.9 F (37.2 C)  TempSrc: Oral  SpO2: 96%  Weight: 254 lb 9.6 oz (115.5 kg)  Height: 5\' 1"  (1.549 m)     Body mass index is 48.11 kg/m.  Physical Exam:   Physical Exam  Constitutional: She appears well-nourished.  HENT:  Head: Normocephalic and atraumatic.  Eyes: Pupils are equal, round, and reactive to light. EOM are normal.  Neck: Normal range of motion. Neck supple.  Cardiovascular: Normal rate, regular rhythm, normal heart sounds and intact distal pulses.  Pulmonary/Chest: Effort normal.  Abdominal: Soft.  Skin: Skin is warm.  Psychiatric: She has a normal mood and affect. Her behavior is normal.  Nursing note and vitals reviewed.  Assessment and Plan:   Patient Active Problem List   Diagnosis Date Noted  . Insulin resistance 04/03/2018  . Morbid  obesity with BMI of 40.0-44.9, adult ([place]) 04/03/2018  . Mild intermittent asthma without complication 79/07/4095  . Nicotine dependence 04/03/2018  . OSA on CPAP 07/01/2013  . Hyperlipidemia with target LDL less than 100 04/17/2013  . Arthritis   . GERD (gastroesophageal reflux disease)   . Migraine headache 08/09/2010  . Essential hypertension 07/28/2010  . LVH (left ventricular hypertrophy) 07/28/2010  . History of cocaine dependence ([place]), in remissioin 09/29/2009  . PTSD 09/29/2009   Essential hypertension Review: taking medications as instructed, no medication side effects noted, no TIAs, no chest pain on exertion, no dyspnea on exertion, no swelling of ankles. Smoker: Yes.  .  Wt Readings from Last 3 Encounters:  04/02/18 254 lb 9.6 oz (115.5 kg)  01/08/15 236 lb 7 oz (107.2 kg)  11/27/14 233 lb (105.7 kg)   BP Readings from Last 3 Encounters:    04/02/18 (!) 150/94  08/07/15 154/93  01/08/15 128/82   Lab Results  Component Value Date   CREATININE 0.73 09/21/2014    OSA on CPAP Compliant, using about 90% of the time per patient report.   Hyperlipidemia with target LDL less than 100 Patient brought in labs from recent GYN visit. HDL 52, LDL 104.   Morbid obesity with BMI of 40.0-44.9, adult [facility]) Patient interested in Bariatric Surgery. We discussed the process. Referral complete. She will complete the webinar and required paperwork to meet the surgeon. Will likely need Cardiology evaluation and EGD. Will clarify smoking status and offer help with cessation. In the interim, will follow for medically supervised weight loss. Rx Saxenda with benefits v risks reviewed. Stop Glipizide.   LVH (left ventricular hypertrophy) Patient states this was seen on CXR and ECHO was ordered at [facility] and she was told it was slightly enlarged no otherwise normal.  Will work to control HTN.  No further workup at this time.  Nicotine dependence I advised patient to quit smoking, and offered support. [place] QUITLINE: 1-800-QUIT-NOW [phone]).   Orders Placed This Encounter  Procedures  . Amb Referral to Bariatric Surgery   Meds ordered this encounter  Medications  . Liraglutide -Weight Management (SAXENDA) 18 MG/3ML SOPN    Sig: Inject 3 mg into the skin daily. Per box instructions.    Dispense:  4 pen    Refill:  6  . Insulin Pen Needle (ASSURE ID SAFETY PEN NEEDLES) 30G X 8 MM MISC    Sig: Inject 10 each into the skin as needed.    Dispense:  1 packet    Refill:  3   . Reviewed expectations re: course of current medical issues. . Discussed self-management of symptoms. . Outlined signs and symptoms indicating need for more acute intervention. . Patient verbalized understanding and all questions were answered. Marland Kitchen Health Maintenance issues including appropriate healthy diet, exercise, and smoking avoidance were discussed with  patient. . See orders for this visit as documented in the electronic medical record. . Patient received an After Visit Summary.  CMA served as Education administrator during this visit. History, Physical, and Plan performed by medical provider. The above documentation has been reviewed and is accurate and complete. Briscoe Deutscher, D.O.  Briscoe Deutscher, DO [place], [facility] 04/03/2018

## 2018-04-02 ENCOUNTER — Telehealth: Payer: Self-pay | Admitting: Family Medicine

## 2018-04-02 ENCOUNTER — Encounter: Payer: Self-pay | Admitting: Family Medicine

## 2018-04-02 ENCOUNTER — Encounter

## 2018-04-02 ENCOUNTER — Ambulatory Visit (INDEPENDENT_AMBULATORY_CARE_PROVIDER_SITE_OTHER): Payer: 59 | Admitting: Family Medicine

## 2018-04-02 VITALS — BP 150/94 | HR 84 | Temp 98.9°F | Ht 61.0 in | Wt 254.6 lb

## 2018-04-02 DIAGNOSIS — I517 Cardiomegaly: Secondary | ICD-10-CM

## 2018-04-02 DIAGNOSIS — F17210 Nicotine dependence, cigarettes, uncomplicated: Secondary | ICD-10-CM

## 2018-04-02 DIAGNOSIS — F4310 Post-traumatic stress disorder, unspecified: Secondary | ICD-10-CM | POA: Diagnosis not present

## 2018-04-02 DIAGNOSIS — J4520 Mild intermittent asthma, uncomplicated: Secondary | ICD-10-CM

## 2018-04-02 DIAGNOSIS — E88819 Insulin resistance, unspecified: Secondary | ICD-10-CM

## 2018-04-02 DIAGNOSIS — G43009 Migraine without aura, not intractable, without status migrainosus: Secondary | ICD-10-CM

## 2018-04-02 DIAGNOSIS — Z9989 Dependence on other enabling machines and devices: Secondary | ICD-10-CM

## 2018-04-02 DIAGNOSIS — E785 Hyperlipidemia, unspecified: Secondary | ICD-10-CM | POA: Diagnosis not present

## 2018-04-02 DIAGNOSIS — K219 Gastro-esophageal reflux disease without esophagitis: Secondary | ICD-10-CM | POA: Diagnosis not present

## 2018-04-02 DIAGNOSIS — F1421 Cocaine dependence, in remission: Secondary | ICD-10-CM

## 2018-04-02 DIAGNOSIS — I10 Essential (primary) hypertension: Secondary | ICD-10-CM | POA: Diagnosis not present

## 2018-04-02 DIAGNOSIS — Z6841 Body Mass Index (BMI) 40.0 and over, adult: Secondary | ICD-10-CM

## 2018-04-02 DIAGNOSIS — G4733 Obstructive sleep apnea (adult) (pediatric): Secondary | ICD-10-CM

## 2018-04-02 DIAGNOSIS — E8881 Metabolic syndrome: Secondary | ICD-10-CM

## 2018-04-02 DIAGNOSIS — R0602 Shortness of breath: Secondary | ICD-10-CM

## 2018-04-02 MED ORDER — LIRAGLUTIDE -WEIGHT MANAGEMENT 18 MG/3ML ~~LOC~~ SOPN: 3.0000 mg | PEN_INJECTOR | Freq: Every day | SUBCUTANEOUS | 6 refills | Status: DC

## 2018-04-02 MED ORDER — INSULIN PEN NEEDLE 30G X 8 MM MISC: 1.0000 | 3 refills | Status: DC | PRN

## 2018-04-02 NOTE — Telephone Encounter (Signed)
See note

## 2018-04-02 NOTE — Patient Instructions (Signed)
Stop Glipizide.  Start [place].

## 2018-04-02 NOTE — Telephone Encounter (Signed)
Copied from [place] [id number]. Topic: Quick Communication - See Telephone Encounter >> Apr 02, 2018  4:10 PM Vernona Rieger wrote: CRM for notification. See Telephone encounter for: 04/02/18.  Insurance is requiring a prior authorization for Liraglutide -Weight Management (SAXENDA) 18 MG/3ML SOPN. Please advise.

## 2018-04-03 ENCOUNTER — Encounter: Payer: Self-pay | Admitting: Family Medicine

## 2018-04-03 ENCOUNTER — Telehealth: Payer: Self-pay

## 2018-04-03 DIAGNOSIS — J4520 Mild intermittent asthma, uncomplicated: Secondary | ICD-10-CM | POA: Insufficient documentation

## 2018-04-03 DIAGNOSIS — Z6841 Body Mass Index (BMI) 40.0 and over, adult: Secondary | ICD-10-CM

## 2018-04-03 DIAGNOSIS — F17200 Nicotine dependence, unspecified, uncomplicated: Secondary | ICD-10-CM | POA: Insufficient documentation

## 2018-04-03 DIAGNOSIS — E8881 Metabolic syndrome: Secondary | ICD-10-CM | POA: Insufficient documentation

## 2018-04-03 DIAGNOSIS — E88819 Insulin resistance, unspecified: Secondary | ICD-10-CM | POA: Insufficient documentation

## 2018-04-03 NOTE — Assessment & Plan Note (Signed)
I advised patient to quit smoking, and offered support. [place] QUITLINE: 1-800-QUIT-NOW (867) [phone]).

## 2018-04-03 NOTE — Assessment & Plan Note (Signed)
Patient interested in Bariatric Surgery. We discussed the process. Referral complete. She will complete the webinar and required paperwork to meet the surgeon. Will likely need Cardiology evaluation and EGD. Will clarify smoking status and offer help with cessation. In the interim, will follow for medically supervised weight loss. Rx Saxenda with benefits v risks reviewed. Stop Glipizide.

## 2018-04-03 NOTE — Assessment & Plan Note (Signed)
Compliant, using about 90% of the time per patient report.

## 2018-04-03 NOTE — Assessment & Plan Note (Signed)
Review: taking medications as instructed, no medication side effects noted, no TIAs, no chest pain on exertion, no dyspnea on exertion, no swelling of ankles. Smoker: Yes.  .  Wt Readings from Last 3 Encounters:  04/02/18 254 lb 9.6 oz (115.5 kg)  01/08/15 236 lb 7 oz (107.2 kg)  11/27/14 233 lb (105.7 kg)   BP Readings from Last 3 Encounters:  04/02/18 (!) 150/94  08/07/15 154/93  01/08/15 128/82   Lab Results  Component Value Date   CREATININE 0.73 09/21/2014

## 2018-04-03 NOTE — Assessment & Plan Note (Signed)
Patient states this was seen on CXR and ECHO was ordered at [facility] and she was told it was slightly enlarged no otherwise normal.  Will work to control HTN.  No further workup at this time.

## 2018-04-03 NOTE — Assessment & Plan Note (Signed)
Patient brought in labs from recent GYN visit. HDL 52, LDL 104.

## 2018-04-03 NOTE — Telephone Encounter (Signed)
See open phone note and my chart message.

## 2018-04-03 NOTE — Telephone Encounter (Signed)
Michelle Trevino ([name])  Rx #: [id number]  Saxenda 18MG Fayne Mediate pen-injectors  Form [employer] Electronic Prior Authorization Form (2017 NCPDP)  Created  21 hours ago  Sent to Plan  6 minutes ago  Plan Response  6 minutes ago  Submit Clinical Questions  1 minute ago  Determination  Wait for Determination Please wait for [employer] to return a determination Will be 72hrs   My chart message sent to patient to let her know.

## 2018-04-22 ENCOUNTER — Ambulatory Visit: Payer: 59 | Admitting: Family Medicine

## 2018-04-22 ENCOUNTER — Encounter: Payer: Self-pay | Admitting: Family Medicine

## 2018-04-22 VITALS — BP 144/88 | HR 68 | Temp 98.7°F | Ht 61.0 in | Wt 253.6 lb

## 2018-04-22 DIAGNOSIS — G43009 Migraine without aura, not intractable, without status migrainosus: Secondary | ICD-10-CM

## 2018-04-22 DIAGNOSIS — F17210 Nicotine dependence, cigarettes, uncomplicated: Secondary | ICD-10-CM | POA: Diagnosis not present

## 2018-04-22 DIAGNOSIS — Z6841 Body Mass Index (BMI) 40.0 and over, adult: Secondary | ICD-10-CM

## 2018-04-22 DIAGNOSIS — E88819 Insulin resistance, unspecified: Secondary | ICD-10-CM

## 2018-04-22 DIAGNOSIS — R350 Frequency of micturition: Secondary | ICD-10-CM

## 2018-04-22 DIAGNOSIS — L709 Acne, unspecified: Secondary | ICD-10-CM

## 2018-04-22 DIAGNOSIS — I10 Essential (primary) hypertension: Secondary | ICD-10-CM | POA: Diagnosis not present

## 2018-04-22 DIAGNOSIS — E8881 Metabolic syndrome: Secondary | ICD-10-CM

## 2018-04-22 DIAGNOSIS — E282 Polycystic ovarian syndrome: Secondary | ICD-10-CM

## 2018-04-22 LAB — COMPREHENSIVE METABOLIC PANEL
ALT: 16 U/L (ref 0–35)
AST: 14 U/L (ref 0–37)
Albumin: 4.2 g/dL (ref 3.5–5.2)
Alkaline Phosphatase: 55 U/L (ref 39–117)
BUN: 9 mg/dL (ref 6–23)
CO2: 28 mEq/L (ref 19–32)
Calcium: 9.6 mg/dL (ref 8.4–10.5)
Chloride: 103 mEq/L (ref 96–112)
Creatinine, Ser: 0.7 mg/dL (ref 0.40–1.20)
GFR: 95.98 mL/min (ref >60.00)
Glucose, Bld: 87 mg/dL (ref 70–99)
Potassium: 3.9 mEq/L (ref 3.5–5.1)
Sodium: 137 mEq/L (ref 135–145)
Total Bilirubin: 0.3 mg/dL (ref 0.2–1.2)
Total Protein: 7.1 g/dL (ref 6.0–8.3)

## 2018-04-22 LAB — POC URINALSYSI DIPSTICK (AUTOMATED)
Bilirubin, UA: NEGATIVE
Blood, UA: NEGATIVE
Glucose, UA: NEGATIVE
Ketones, UA: NEGATIVE
Leukocytes, UA: NEGATIVE
Nitrite, UA: NEGATIVE
Protein, UA: NEGATIVE
Spec Grav, UA: 1.025 (ref 1.010–1.025)
Urobilinogen, UA: 0.2 E.U./dL
pH, UA: 6 (ref 5.0–8.0)

## 2018-04-22 MED ORDER — SPIRONOLACTONE 50 MG PO TABS: 50.0000 mg | ORAL_TABLET | Freq: Every day | ORAL | 1 refills | Status: DC

## 2018-04-22 MED ORDER — TOPIRAMATE ER 50 MG PO CAP24: 50.0000 mg | ORAL_CAPSULE | Freq: Every day | ORAL | 0 refills | Status: DC

## 2018-04-22 NOTE — Progress Notes (Signed)
Michelle Trevino is a 47 y.o. female is here for follow up.  History of Present Illness:   [name], CMA acting as scribe for Dr. Briscoe Deutscher.   HPI:  Essential hypertension Review: taking medications as instructed, no medication side effects noted, no TIAs, no chest pain on exertion, no dyspnea on exertion, no swelling of ankles.  Smoker: Yes.    Wt Readings from Last 3 Encounters:  04/22/18 253 lb 9.6 oz (115 kg)  04/02/18 254 lb 9.6 oz (115.5 kg)  01/08/15 236 lb 7 oz (107.2 kg)   BP Readings from Last 3 Encounters:  04/22/18 (!) 144/88  04/02/18 (!) 150/94  08/07/15 154/93   Lab Results  Component Value Date   CREATININE 0.70 04/22/2018   OSA on CPAP Compliant, using about 90% of the time per patient report.   Morbid obesity with BMI of 40.0-44.9, adult [facility]) Patient interested in Bariatric Surgery. We discussed the process. Referral complete. She completed  the webinar and required paperwork to meet the surgeon. She has an appointment for evaluation on 04/26/18 . We are still working on getting insurance to approve the saxenda. She has noticed that she has had nausea with headaches.   Nicotine dependence I advised patient to quit smoking, and offered support. [place] QUITLINE: 1-800-QUIT-NOW 6600124950). Still smoking but motivated to quit.  Health Maintenance Due  Topic Date Due  . HIV Screening  11/06/1987  . PAP SMEAR  08/16/2013  . TETANUS/TDAP  01/03/2018   Depression screen PHQ 2/9 04/02/2018 01/08/2015 11/27/2014  Decreased Interest 0 0 0  Down, Depressed, Hopeless 0 0 0  PHQ - 2 Score 0 0 0   PMHx, SurgHx, SocialHx, FamHx, Medications, and Allergies were reviewed in the Visit Navigator and updated as appropriate.   Patient Active Problem List   Diagnosis Date Noted  . Insulin resistance 04/03/2018  . Morbid obesity with BMI of 40.0-44.9, adult ([place]) 04/03/2018  . Mild intermittent asthma without complication 82/99/3716  . Nicotine  dependence 04/03/2018  . OSA on CPAP 07/01/2013  . Hyperlipidemia with target LDL less than 100 04/17/2013  . Arthritis   . GERD (gastroesophageal reflux disease)   . Migraine headache 08/09/2010  . Essential hypertension 07/28/2010  . LVH (left ventricular hypertrophy) 07/28/2010  . PTSD 09/29/2009   Social History   Tobacco Use  . Smoking status: Current Every Day Smoker  . Smokeless tobacco: Never Used  Substance Use Topics  . Alcohol use: Yes    Alcohol/week: 1.0 standard drinks    Types: 1 Glasses of wine per week    Comment: every 1 wks  . Drug use: No    Comment: HISTORY OF COCAINE DEPENDENCE   Current Medications and Allergies:   .  albuterol (PROVENTIL HFA;VENTOLIN HFA) 108 (90 Base) MCG/ACT inhaler, , Disp: , Rfl:  .  Liraglutide -Weight Management (SAXENDA) 18 MG/3ML SOPN, Inject 3 mg into the skin daily. Per box instructions., Disp: 4 pen, Rfl: 6 .  losartan (COZAAR) 25 MG tablet, TK 1 T PO QD, Disp: , Rfl: 0   Allergies  Allergen Reactions  . Lisinopril     cough   Review of Systems   Pertinent items are noted in the HPI. Otherwise, ROS is negative.  Vitals:   Vitals:   04/22/18 1120  BP: (!) 144/88  Pulse: 68  Temp: 98.7 F (37.1 C)  TempSrc: Oral  SpO2: 97%  Weight: 253 lb 9.6 oz (115 kg)  Height: 5\' 1"  (1.549 m)  Body mass index is 47.92 kg/m.  Physical Exam:   Physical Exam  Constitutional: She is oriented to person, place, and time. She appears well-developed and well-nourished. No distress.  HENT:  Head: Normocephalic and atraumatic.  Right Ear: External ear normal.  Left Ear: External ear normal.  Nose: Nose normal.  Mouth/Throat: Oropharynx is clear and moist.  Eyes: Pupils are equal, round, and reactive to light. Conjunctivae and EOM are normal.  Neck: Normal range of motion. Neck supple. No thyromegaly present.  Cardiovascular: Normal rate, regular rhythm, normal heart sounds and intact distal pulses.  Pulmonary/Chest:  Effort normal and breath sounds normal.  Abdominal: Soft. Bowel sounds are normal.  Musculoskeletal: Normal range of motion.  Lymphadenopathy:    She has no cervical adenopathy.  Neurological: She is alert and oriented to person, place, and time.  Skin: Skin is warm and dry. Capillary refill takes less than 2 seconds.  Facial acne.  Psychiatric: She has a normal mood and affect. Her behavior is normal.  Nursing note and vitals reviewed.  Assessment and Plan:   [name] was seen today for follow-up.  Diagnoses and all orders for this visit:  Essential hypertension -     Comprehensive metabolic panel  Insulin resistance  Cigarette nicotine dependence without complication Comments: Discussion that cessation is essential for surgery.  Migraine without aura and without status migrainosus, not intractable Comments: Worsened. Add Topamax. Orders: -     Topiramate ER (TROKENDI XR) 50 MG CP24; Take 50 mg by mouth daily.  Morbid obesity with BMI of 40.0-44.9, adult (HCC)  PCOS (polycystic ovarian syndrome)  Frequent urination -     POCT Urinalysis Dipstick (Automated)  Adult acne -     spironolactone (ALDACTONE) 50 MG tablet; Take 1 tablet (50 mg total) by mouth daily.    . Reviewed expectations re: course of current medical issues. . Discussed self-management of symptoms. . Outlined signs and symptoms indicating need for more acute intervention. . Patient verbalized understanding and all questions were answered. Marland Kitchen Health Maintenance issues including appropriate healthy diet, exercise, and smoking avoidance were discussed with patient. . See orders for this visit as documented in the electronic medical record. . Patient received an After Visit Summary.  CMA served as Education administrator during this visit. History, Physical, and Plan performed by medical provider. The above documentation has been reviewed and is accurate and complete. Briscoe Deutscher, D.O.  Briscoe Deutscher, DO [place], [facility] 04/25/2018

## 2018-04-22 NOTE — Patient Instructions (Signed)
Start the Trokendi as directed.  Continue on the saxenda. Samples given today.  Stop the Losartan and start the spironolactone daily.

## 2018-04-24 ENCOUNTER — Encounter: Payer: Self-pay | Admitting: Family Medicine

## 2018-05-06 ENCOUNTER — Encounter: Payer: Self-pay | Admitting: Family Medicine

## 2018-05-07 ENCOUNTER — Other Ambulatory Visit (HOSPITAL_COMMUNITY): Payer: Self-pay | Admitting: General Surgery

## 2018-05-07 ENCOUNTER — Encounter: Payer: Self-pay | Admitting: Skilled Nursing Facility1

## 2018-05-07 ENCOUNTER — Encounter: Payer: 59 | Attending: General Surgery | Admitting: Skilled Nursing Facility1

## 2018-05-07 DIAGNOSIS — Z713 Dietary counseling and surveillance: Secondary | ICD-10-CM | POA: Diagnosis not present

## 2018-05-07 DIAGNOSIS — Z6841 Body Mass Index (BMI) 40.0 and over, adult: Secondary | ICD-10-CM | POA: Diagnosis not present

## 2018-05-07 DIAGNOSIS — E669 Obesity, unspecified: Secondary | ICD-10-CM

## 2018-05-07 NOTE — Progress Notes (Signed)
Pre-Op Assessment Visit:  Pre-Operative Sleeve Surgery  Medical Nutrition Therapy:  Appt start time: 7:25 End time:    Patient was seen on 05/07/2018 for Pre-Operative Nutrition Assessment. Assessment and letter of approval faxed to [facility] Surgery Bariatric Surgery Program coordinator on 05/07/2018.   Pt states she does have diabetes but does not know her A1C (this information is not documented in Epic) stating she does not check her blood sugars. Pt reports shaky, sweaty, fatigued, and headaches daily. Pt states he has heart burn and nausea and headaches (trokendi helps) with taking the liraglatide. 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 Pt states she is working on trying to quit smoking.   Pt expectation of surgery: to be healthy   Pt expectation of Dietitian: to help me see things in a different light  Start weight at [facility]: 246.5 BMI: 46.27  24 hr Dietary Recall: First Meal: english muffin and 2 scrambled eggs with cream cheese  Snack: 5 crackers with cream cheese  Second Meal: crackers and cream cheese Snack:  Third Meal: kale and cauliflower with cheese and steak and naan Snack: rice pudding  Beverages: wine, water, juice, coffee with cream and sugar  Encouraged to engage in 150 minutes of moderate physical activity including cardiovascular and weight baring weekly  Handouts given during visit include:  . Pre-Op Goals . Bariatric Surgery Protein Shakes During the appointment today the following Pre-Op Goals were reviewed with the patient: . Maintain or lose weight as instructed by your surgeon . Make healthy food choices . Begin to limit portion sizes . Limited concentrated sugars and fried foods . Keep fat/sugar in the single digits per serving on             food labels . Practice CHEWING your food  (aim for 30 chews per bite or until applesauce consistency) . Practice not drinking 15 minutes before, during, and 30 minutes after each meal/snack . Avoid all carbonated  beverages  . Avoid/limit caffeinated beverages  . Avoid all sugar-sweetened beverages . Consume 3 meals per day; eat every 3-5 hours . Make a list of non-food related activities . Aim for 64-100 ounces of FLUID daily  . Aim for at least 60-80 grams of PROTEIN daily . Look for a liquid protein source that contain ?15 g protein and ?5 g carbohydrate  (ex: shakes, drinks, shots)  -Follow diet recommendations listed below   Energy and Macronutrient Recomendations: Calories: 1500 Carbohydrate: 170 Protein: 112 Fat: 42  Demonstrated degree of understanding via:  Teach Back  Teaching Method Utilized:  Visual Auditory Hands on  Barriers to learning/adherence to lifestyle change: none identified   Patient to call the [facility] to enroll in Pre-Op and Post-Op Nutrition Education when surgery date is scheduled.

## 2018-05-10 ENCOUNTER — Other Ambulatory Visit: Payer: Self-pay

## 2018-05-10 MED ORDER — TOPIRAMATE 50 MG PO TABS: 50.0000 mg | ORAL_TABLET | Freq: Two times a day (BID) | ORAL | 3 refills | Status: DC

## 2018-05-17 ENCOUNTER — Ambulatory Visit (HOSPITAL_COMMUNITY)
Admission: RE | Admit: 2018-05-17 | Discharge: 2018-05-17 | Disposition: A | Discharge status: Home / Self Care | Payer: 59 | Source: Ambulatory Visit | Attending: General Surgery | Admitting: General Surgery

## 2018-05-19 NOTE — Progress Notes (Signed)
Michelle Trevino is a 47 y.o. female is here for follow up.  History of Present Illness:   Michelle Trevino, CMA acting as scribe for Dr. Briscoe Deutscher.   HPI: Patient in for follow up on medications. She is going through insurance to get bariatric surgery. She has her psychology evaluation in January. She has been on Saxenda with 10lbs weight loss in one month.  She has been able to decrease smoking from 10 cigarettes daily to 5 daily.  She does have a scheduled quit date of January 1.  She is working on changing many of her habits, including incorporating exercise into her daily schedule.  She is working on adding protein to her diet and trying some of the protein drinks recommended by the bariatric program.  She has seen the dietitian, gone to the support group class, and will have an upcoming visit with the psychologist.  She is confused because she has 2 scheduled appointments with a psychologist and does not understand why.  Health Maintenance Due  Topic Date Due  . HIV Screening  11/06/1987  . PAP SMEAR-Modifier  08/16/2013  . TETANUS/TDAP  01/03/2018   Depression screen PHQ 2/9 04/02/2018 01/08/2015 11/27/2014  Decreased Interest 0 0 0  Down, Depressed, Hopeless 0 0 0  PHQ - 2 Score 0 0 0   PMHx, SurgHx, SocialHx, FamHx, Medications, and Allergies were reviewed in the Visit Navigator and updated as appropriate.   Patient Active Problem List   Diagnosis Date Noted  . Insulin resistance 04/03/2018  . Morbid obesity with BMI of 40.0-44.9, adult ([place]) 04/03/2018  . Mild intermittent asthma without complication 50/27/7412  . Nicotine dependence 04/03/2018  . OSA on CPAP 07/01/2013  . Hyperlipidemia with target LDL less than 100 04/17/2013  . Arthritis   . GERD (gastroesophageal reflux disease)   . Migraine headache 08/09/2010  . Essential hypertension 07/28/2010  . LVH (left ventricular hypertrophy) 07/28/2010  . PTSD 09/29/2009   Social History   Tobacco Use  . Smoking  status: Current Every Day Smoker  . Smokeless tobacco: Never Used  Substance Use Topics  . Alcohol use: Yes    Alcohol/week: 1.0 standard drinks    Types: 1 Glasses of wine per week    Comment: every 1 wks  . Drug use: No    Comment: HISTORY OF COCAINE DEPENDENCE   Current Medications and Allergies:   .  albuterol (PROVENTIL HFA;VENTOLIN HFA) 108 (90 Base) MCG/ACT inhaler, , Disp: , Rfl:  .  Liraglutide -Weight Management (SAXENDA) 18 MG/3ML SOPN, Inject 3 mg into the skin daily. Per box instructions., Disp: 4 pen, Rfl: 6 .  losartan (COZAAR) 25 MG tablet, TK 1 T PO QD, Disp: , Rfl: 0 .  spironolactone (ALDACTONE) 50 MG tablet, Take 1 tablet (50 mg total) by mouth daily., Disp: 30 tablet, Rfl: 1 .  topiramate (TOPAMAX) 50 MG tablet, Take 1 tablet (50 mg total) by mouth 2 (two) times daily., Disp: 60 tablet, Rfl: 3   Allergies  Allergen Reactions  . Lisinopril     cough   Review of Systems   Pertinent items are noted in the HPI. Otherwise, a complete ROS is negative.  Vitals:   Vitals:   05/20/18 1132  BP: 134/88  Pulse: 100  Temp: 98.6 F (37 C)  TempSrc: Oral  SpO2: 98%  Weight: 245 lb (111.1 kg)  Height: 5\' 1"  (1.549 m)     Body mass index is 46.29 kg/m.  Physical Exam:  Physical Exam Vitals signs and nursing note reviewed.  HENT:     Head: Normocephalic and atraumatic.  Eyes:     Pupils: Pupils are equal, round, and reactive to light.  Neck:     Musculoskeletal: Normal range of motion and neck supple.  Cardiovascular:     Rate and Rhythm: Normal rate and regular rhythm.     Heart sounds: Normal heart sounds.  Pulmonary:     Effort: Pulmonary effort is normal.  Abdominal:     Palpations: Abdomen is soft.  Skin:    General: Skin is warm.  Psychiatric:        Behavior: Behavior normal.    Results for orders placed or performed in visit on 04/22/18  Comprehensive metabolic panel  Result Value Ref Range   Sodium 137 135 - 145 mEq/L   Potassium 3.9  3.5 - 5.1 mEq/L   Chloride 103 96 - 112 mEq/L   CO2 28 19 - 32 mEq/L   Glucose, Bld 87 70 - 99 mg/dL   BUN 9 6 - 23 mg/dL   Creatinine, Ser 0.70 0.40 - 1.20 mg/dL   Total Bilirubin 0.3 0.2 - 1.2 mg/dL   Alkaline Phosphatase 55 39 - 117 U/L   AST 14 0 - 37 U/L   ALT 16 0 - 35 U/L   Total Protein 7.1 6.0 - 8.3 g/dL   Albumin 4.2 3.5 - 5.2 g/dL   Calcium 9.6 8.4 - 10.5 mg/dL   GFR 95.98 >60.00 mL/min  POCT Urinalysis Dipstick (Automated)  Result Value Ref Range   Color, UA yellow    Clarity, UA hazy    Glucose, UA Negative Negative   Bilirubin, UA N    Ketones, UA N    Spec Grav, UA 1.025 1.010 - 1.025   Blood, UA N    pH, UA 6.0 5.0 - 8.0   Protein, UA Negative Negative   Urobilinogen, UA 0.2 0.2 or 1.0 E.U./dL   Nitrite, UA N    Leukocytes, UA Negative Negative   Assessment and Plan:   Michelle Trevino was seen today for follow-up. She is following the Bariatric Program. Excited about surgery. Working hard on smoking cessation. Exercising, through walking. Decreased carbohydrate intake.   Diagnoses and all orders for this visit:  Insulin resistance  Morbid obesity with BMI of 40.0-44.9, adult ([place])  Cigarette nicotine dependence without complication  Essential hypertension  Hyperlipidemia with target LDL less than 100  Gastroesophageal reflux disease without esophagitis   . Orders and follow up as documented in [place], reviewed diet, exercise and weight control, cardiovascular risk and specific lipid/LDL goals reviewed, reviewed medications and side effects in detail.  . Reviewed expectations re: course of current medical issues. . Outlined signs and symptoms indicating need for more acute intervention. . Patient verbalized understanding and all questions were answered. . Patient received an After Visit Summary.  CMA served as Education administrator during this visit. History, Physical, and Plan performed by medical provider. The above documentation has been reviewed and is accurate and  complete. Briscoe Deutscher, D.O.  Briscoe Deutscher, DO [place], [facility] 05/21/2018

## 2018-05-20 ENCOUNTER — Ambulatory Visit: Payer: 59 | Admitting: Family Medicine

## 2018-05-20 ENCOUNTER — Encounter: Payer: Self-pay | Admitting: Skilled Nursing Facility1

## 2018-05-20 ENCOUNTER — Encounter: Payer: Self-pay | Admitting: Family Medicine

## 2018-05-20 ENCOUNTER — Encounter: Payer: 59 | Admitting: Skilled Nursing Facility1

## 2018-05-20 VITALS — BP 134/88 | HR 100 | Temp 98.6°F | Ht 61.0 in | Wt 245.0 lb

## 2018-05-20 DIAGNOSIS — F17210 Nicotine dependence, cigarettes, uncomplicated: Secondary | ICD-10-CM

## 2018-05-20 DIAGNOSIS — E8881 Metabolic syndrome: Secondary | ICD-10-CM

## 2018-05-20 DIAGNOSIS — Z6841 Body Mass Index (BMI) 40.0 and over, adult: Secondary | ICD-10-CM

## 2018-05-20 DIAGNOSIS — I10 Essential (primary) hypertension: Secondary | ICD-10-CM | POA: Diagnosis not present

## 2018-05-20 DIAGNOSIS — E669 Obesity, unspecified: Secondary | ICD-10-CM

## 2018-05-20 DIAGNOSIS — K219 Gastro-esophageal reflux disease without esophagitis: Secondary | ICD-10-CM

## 2018-05-20 DIAGNOSIS — E88819 Insulin resistance, unspecified: Secondary | ICD-10-CM

## 2018-05-20 DIAGNOSIS — E785 Hyperlipidemia, unspecified: Secondary | ICD-10-CM

## 2018-05-20 NOTE — Patient Instructions (Signed)
You are doing so well! I'll all and check in re: your upcoming appointments.

## 2018-05-20 NOTE — Progress Notes (Signed)
Pre-Operative Nutrition Class:  Appt start time: 1504   End time:  1830.  Patient was seen on 05/20/2018 for Pre-Operative Bariatric Surgery Education at the [facility].   Surgery date:  Surgery type: sleeve [facility]: 246.5 Weight today: 244.8  Samples given per MNT protocol. Patient educated on appropriate usage: Bariatric Advantage Multivitamin Lot #  [id number] Exp: 4/21  Bariatric Advantage Calcium  Lot # [id number] Exp: 12/28/2018  Renee Pain Protein  Shake Lot # [id number] Exp: 12/12/18  Bariatric advantage: Lot: [id number]: 03/21  Protein 20: Lot: [id number]: 06/30/19  The following the learning objectives were met by the patient during this course:  Identify Pre-Op Dietary Goals and will begin 2 weeks pre-operatively  Identify appropriate sources of fluids and proteins   State protein recommendations and appropriate sources pre and post-operatively  Identify Post-Operative Dietary Goals and will follow for 2 weeks post-operatively  Identify appropriate multivitamin and calcium sources  Describe the need for physical activity post-operatively and will follow MD recommendations  State when to call healthcare provider regarding medication questions or post-operative complications  Handouts given during class include:  Pre-Op Bariatric Surgery Diet Handout  Protein Shake Handout  Post-Op Bariatric Surgery Nutrition Handout  [facility] Program Information Flyer  Support Group Information Flyer  [facility] Bariatric Supplements Price List  [facility]: [facility] advancement per MD.

## 2018-05-21 ENCOUNTER — Encounter: Payer: Self-pay | Admitting: Family Medicine

## 2018-06-11 ENCOUNTER — Ambulatory Visit: Payer: 59

## 2018-06-11 ENCOUNTER — Ambulatory Visit (INDEPENDENT_AMBULATORY_CARE_PROVIDER_SITE_OTHER): Payer: 59 | Admitting: Psychiatry

## 2018-06-11 DIAGNOSIS — F509 Eating disorder, unspecified: Secondary | ICD-10-CM

## 2018-06-26 ENCOUNTER — Encounter: Payer: Self-pay | Admitting: Family Medicine

## 2018-06-28 ENCOUNTER — Other Ambulatory Visit: Payer: Self-pay

## 2018-06-28 MED ORDER — PREDNISONE 5 MG PO TABS: 5.0000 mg | ORAL_TABLET | Freq: Every day | ORAL | 0 refills | Status: DC

## 2018-06-30 NOTE — Progress Notes (Signed)
Michelle Trevino is a 48 y.o. female is here for follow up.  History of Present Illness:   Michelle Trevino, CMA acting as scribe for Dr. Briscoe Trevino.   HPI:   Morbid obesity with BMI of 40.0-44.9, adult [facility]) She is taking Saxenda 68ml daily. She has finished all of the steps for bariatric surgery. She only needs to turn in all the evaluations and she will schedule. She is down 10lbs from last visit.   Wt Readings from Last 3 Encounters:  07/01/18 235 lb (106.6 kg)  05/20/18 245 lb (111.1 kg)  05/20/18 244 lb 12.8 oz (111 kg)   Cigarette nicotine dependence without complication.  Significantly decreased. Ready to quit "cold [place]" as soon as she has a surgery date.   Essential hypertension She has been taking Spironolactone with no issues or side effects.  Review: taking medications as instructed, no medication side effects noted, no TIAs, no chest pain on exertion, no dyspnea on exertion, no swelling of ankles. Smoker: Yes.     BP Readings from Last 3 Encounters:  07/01/18 132/78  05/20/18 134/88  04/22/18 (!) 144/88   Lab Results  Component Value Date   CREATININE 0.70 04/22/2018   CREATININE 0.73 09/21/2014   CREATININE 0.8 07/03/2013     Hyperlipidemia with target LDL less than 100 Lab Results  Component Value Date   CHOL 120 07/03/2013   HDL 38.50 (L) 07/03/2013   LDLCALC 55 07/03/2013   TRIG 133.0 07/03/2013   CHOLHDL 3 07/03/2013   Lab Results  Component Value Date   ALT 16 04/22/2018   AST 14 04/22/2018   ALKPHOS 55 04/22/2018   BILITOT 0.3 04/22/2018   Health Maintenance Due  Topic Date Due  . HIV Screening  11/06/1987  . PAP SMEAR-Modifier  08/16/2013  . TETANUS/TDAP  01/03/2018   Depression screen PHQ 2/9 04/02/2018 01/08/2015 11/27/2014  Decreased Interest 0 0 0  Down, Depressed, Hopeless 0 0 0  PHQ - 2 Score 0 0 0   PMHx, SurgHx, SocialHx, FamHx, Medications, and Allergies were reviewed in the Visit Navigator and updated as appropriate.     Patient Active Problem List   Diagnosis Date Noted  . Insulin resistance 04/03/2018  . Morbid obesity with BMI of 40.0-44.9, adult ([place]) 04/03/2018  . Mild intermittent asthma without complication 37/90/2409  . Nicotine dependence 04/03/2018  . OSA on CPAP 07/01/2013  . Hyperlipidemia with target LDL less than 100 04/17/2013  . Arthritis   . GERD (gastroesophageal reflux disease)   . Migraine headache 08/09/2010  . Essential hypertension 07/28/2010  . LVH (left ventricular hypertrophy) 07/28/2010  . PTSD 09/29/2009   Social History   Tobacco Use  . Smoking status: Current Every Day Smoker  . Smokeless tobacco: Never Used  Substance Use Topics  . Alcohol use: Yes    Alcohol/week: 1.0 standard drinks    Types: 1 Glasses of wine per week    Comment: every 1 wks  . Drug use: No    Comment: HISTORY OF COCAINE DEPENDENCE   Current Medications and Allergies   .  albuterol (PROVENTIL HFA;VENTOLIN HFA) 108 (90 Base) MCG/ACT inhaler, , Disp: , Rfl:  .  Liraglutide -Weight Management (SAXENDA) 18 MG/3ML SOPN, Inject 3 mg into the skin daily. Per box instructions., Disp: 4 pen, Rfl: 6 .  losartan (COZAAR) 25 MG tablet, TK 1 T PO QD, Disp: , Rfl: 0 .  predniSONE (DELTASONE) 5 MG tablet, Take 1 tablet (5 mg total) by mouth daily  with breakfast. 6-5-4-3-2-1, Disp: 21 tablet, Rfl: 0 .  spironolactone (ALDACTONE) 50 MG tablet, Take 1 tablet (50 mg total) by mouth daily., Disp: 30 tablet, Rfl: 1 .  topiramate (TOPAMAX) 50 MG tablet, Take 1 tablet (50 mg total) by mouth 2 (two) times daily., Disp: 60 tablet, Rfl: 3   Allergies  Allergen Reactions  . Lisinopril     cough   Review of Systems   Pertinent items are noted in the HPI. Otherwise, a complete ROS is negative.  Vitals   Vitals:   07/01/18 1037  BP: 132/78  Pulse: 92  Temp: 98.9 F (37.2 C)  TempSrc: Oral  SpO2: 97%  Weight: 235 lb (106.6 kg)  Height: 5\' 1"  (1.549 m)     Body mass index is 44.4 kg/m.  Physical  Exam   Physical Exam Vitals signs and nursing note reviewed.  HENT:     Head: Normocephalic and atraumatic.  Eyes:     Pupils: Pupils are equal, round, and reactive to light.  Neck:     Musculoskeletal: Normal range of motion and neck supple.  Cardiovascular:     Rate and Rhythm: Normal rate and regular rhythm.     Heart sounds: Normal heart sounds.  Pulmonary:     Effort: Pulmonary effort is normal.  Abdominal:     Palpations: Abdomen is soft.  Skin:    General: Skin is warm.  Psychiatric:        Behavior: Behavior normal.    Assessment and Plan   Michelle Trevino was seen today for follow-up.  Diagnoses and all orders for this visit:  Hyperlipidemia with target LDL less than 100  Screening for breast cancer -     MM 3D SCREEN BREAST BILATERAL; Future  Need for Tdap vaccination -     Tdap vaccine greater than or equal to 7yo IM  Insulin resistance  Morbid obesity with BMI of 40.0-44.9, adult [facility]) Comments: Hoping for bariatric surgery after work trip in Feb. Down 10 pounds.   Cigarette nicotine dependence without complication Comments: Discussed the importance of cessation.  OSA on CPAP  Essential hypertension  Gastroesophageal reflux disease without esophagitis   . Orders and follow up as documented in [place], reviewed diet, exercise and weight control, cardiovascular risk and specific lipid/LDL goals reviewed, reviewed medications and side effects in detail.  . Reviewed expectations re: course of current medical issues. . Outlined signs and symptoms indicating need for more acute intervention. . Patient verbalized understanding and all questions were answered. . Patient received an After Visit Summary.  Michelle Deutscher, DO [place], [facility] [place] 07/01/2018  CMA served as Education administrator during this visit. History, Physical, and Plan performed by medical provider. The above documentation has been reviewed and is accurate and complete. Michelle Trevino, D.O.

## 2018-07-01 ENCOUNTER — Encounter: Payer: Self-pay | Admitting: Family Medicine

## 2018-07-01 ENCOUNTER — Ambulatory Visit: Payer: 59 | Admitting: Family Medicine

## 2018-07-01 VITALS — BP 132/78 | HR 92 | Temp 98.9°F | Ht 61.0 in | Wt 235.0 lb

## 2018-07-01 DIAGNOSIS — Z6841 Body Mass Index (BMI) 40.0 and over, adult: Secondary | ICD-10-CM

## 2018-07-01 DIAGNOSIS — E785 Hyperlipidemia, unspecified: Secondary | ICD-10-CM | POA: Diagnosis not present

## 2018-07-01 DIAGNOSIS — Z23 Encounter for immunization: Secondary | ICD-10-CM

## 2018-07-01 DIAGNOSIS — G4733 Obstructive sleep apnea (adult) (pediatric): Secondary | ICD-10-CM

## 2018-07-01 DIAGNOSIS — F17210 Nicotine dependence, cigarettes, uncomplicated: Secondary | ICD-10-CM

## 2018-07-01 DIAGNOSIS — K219 Gastro-esophageal reflux disease without esophagitis: Secondary | ICD-10-CM

## 2018-07-01 DIAGNOSIS — E8881 Metabolic syndrome: Secondary | ICD-10-CM

## 2018-07-01 DIAGNOSIS — Z1239 Encounter for other screening for malignant neoplasm of breast: Secondary | ICD-10-CM | POA: Diagnosis not present

## 2018-07-01 DIAGNOSIS — E88819 Insulin resistance, unspecified: Secondary | ICD-10-CM

## 2018-07-01 DIAGNOSIS — I10 Essential (primary) hypertension: Secondary | ICD-10-CM

## 2018-07-01 DIAGNOSIS — Z9989 Dependence on other enabling machines and devices: Secondary | ICD-10-CM

## 2018-07-01 NOTE — Patient Instructions (Addendum)
You have an appointment scheduled for: []   2D Mammogram  [x]   3D Mammogram  []   Bone Density   Call number below to make appointment.    Your appointment will at the following location  [x]   [facility]      [place], [place]            Make sure to wear two peace clothing  No lotions powders or deodorants the day of the appointment Make sure to bring picture ID and insurance card.  Bring list of medications you are currently taking including any supplements.

## 2018-07-04 ENCOUNTER — Encounter: Payer: Self-pay | Admitting: Family Medicine

## 2018-07-04 ENCOUNTER — Ambulatory Visit (INDEPENDENT_AMBULATORY_CARE_PROVIDER_SITE_OTHER): Payer: 59 | Admitting: Psychiatry

## 2018-07-04 DIAGNOSIS — F509 Eating disorder, unspecified: Secondary | ICD-10-CM | POA: Diagnosis not present

## 2018-07-05 ENCOUNTER — Other Ambulatory Visit: Payer: Self-pay | Admitting: Family Medicine

## 2018-07-05 ENCOUNTER — Encounter: Payer: Self-pay | Admitting: Family Medicine

## 2018-07-05 DIAGNOSIS — L709 Acne, unspecified: Secondary | ICD-10-CM

## 2018-07-05 MED ORDER — SPIRONOLACTONE 50 MG PO TABS: 50.0000 mg | ORAL_TABLET | Freq: Every day | ORAL | 2 refills | Status: DC

## 2018-07-14 ENCOUNTER — Encounter: Payer: Self-pay | Admitting: Family Medicine

## 2018-07-22 ENCOUNTER — Telehealth: Payer: Self-pay | Admitting: Skilled Nursing Facility1

## 2018-07-22 NOTE — Telephone Encounter (Signed)
Pt called with Medication questions: Dietitian advised the pt to ask those questions of her surgeon.

## 2018-08-07 ENCOUNTER — Other Ambulatory Visit: Payer: Self-pay

## 2018-08-07 ENCOUNTER — Encounter (HOSPITAL_COMMUNITY)
Admission: RE | Admit: 2018-08-07 | Discharge: 2018-08-07 | Disposition: A | Discharge status: Home / Self Care | Payer: 59 | Source: Ambulatory Visit | Attending: General Surgery | Admitting: General Surgery

## 2018-08-07 ENCOUNTER — Encounter (HOSPITAL_COMMUNITY): Payer: Self-pay

## 2018-08-07 DIAGNOSIS — Z01812 Encounter for preprocedural laboratory examination: Secondary | ICD-10-CM | POA: Diagnosis present

## 2018-08-07 LAB — CBC WITH DIFFERENTIAL/PLATELET
ABS IMMATURE GRANULOCYTES: 0.02 10*3/uL (ref 0.00–0.07)
Basophils Absolute: 0 10*3/uL (ref 0.0–0.1)
Basophils Relative: 1 %
Eosinophils Absolute: 0.1 10*3/uL (ref 0.0–0.5)
Eosinophils Relative: 2 %
HCT: 39.9 % (ref 36.0–46.0)
Hemoglobin: 12.6 g/dL (ref 12.0–15.0)
Immature Granulocytes: 0 %
Lymphocytes Relative: 26 %
Lymphs Abs: 1.4 10*3/uL (ref 0.7–4.0)
MCH: 30.1 pg (ref 26.0–34.0)
MCHC: 31.6 g/dL (ref 30.0–36.0)
MCV: 95.2 fL (ref 80.0–100.0)
Monocytes Absolute: 0.3 10*3/uL (ref 0.1–1.0)
Monocytes Relative: 5 %
Neutro Abs: 3.5 10*3/uL (ref 1.7–7.7)
Neutrophils Relative %: 66 %
Platelets: 375 10*3/uL (ref 150–400)
RBC: 4.19 MIL/uL (ref 3.87–5.11)
RDW: 12.2 % (ref 11.5–15.5)
WBC: 5.4 10*3/uL (ref 4.0–10.5)
nRBC: 0 % (ref 0.0–0.2)

## 2018-08-07 LAB — COMPREHENSIVE METABOLIC PANEL
ALT: 19 U/L (ref 0–44)
AST: 16 U/L (ref 15–41)
Albumin: 4.3 g/dL (ref 3.5–5.0)
Alkaline Phosphatase: 55 U/L (ref 38–126)
Anion gap: 8 (ref 5–15)
BUN: 20 mg/dL (ref 6–20)
CO2: 24 mmol/L (ref 22–32)
Calcium: 9.8 mg/dL (ref 8.9–10.3)
Chloride: 105 mmol/L (ref 98–111)
Creatinine, Ser: 0.97 mg/dL (ref 0.44–1.00)
GFR calc non Af Amer: >60 mL/min (ref >60)
Glucose, Bld: 128 mg/dL — ABNORMAL HIGH (ref 70–99)
Potassium: 4.2 mmol/L (ref 3.5–5.1)
Sodium: 137 mmol/L (ref 135–145)
Total Bilirubin: 0.2 mg/dL — ABNORMAL LOW (ref 0.3–1.2)
Total Protein: 7.7 g/dL (ref 6.5–8.1)

## 2018-08-07 LAB — GLUCOSE, CAPILLARY: Glucose-Capillary: 101 mg/dL — ABNORMAL HIGH (ref 70–99)

## 2018-08-07 MED ORDER — ENSURE PRE-SURGERY PO LIQD
296.0000 mL | Freq: Once | ORAL | Status: DC
  Filled 2018-08-07: qty 296

## 2018-08-07 NOTE — Progress Notes (Signed)
05/17/2018- noted in Epic- EKG and CXR

## 2018-08-07 NOTE — Patient Instructions (Addendum)
Avalynne Diver  08/07/2018   Your procedure is scheduled on: Monday 08/12/2018  Report to [facility]  Entrance              Report to admitting at   [phone] AM     Call this number if you have problems the morning of surgery [phone]               Bring CPAP mask and tubing with you to hospital!   Remember: Do not eat food  :After  6 pm   MORNING OF SURGERY DRINK:  [place], DRINK ALL OF THE SHAKE AT ONE TIME.   NO SOLID FOOD AFTER 600 PM THE NIGHT BEFORE YOUR SURGERY. YOU MAY DRINK CLEAR FLUIDS. THE SHAKE YOU DRINK BEFORE YOU LEAVE HOME WILL BE THE LAST FLUIDS YOU DRINK BEFORE SURGERY.    CLEAR LIQUID DIET   Foods Allowed                                                                     Foods Excluded  Coffee and tea, regular and decaf                             liquids that you cannot  Plain Jell-O in any flavor                                             see through such as: Fruit ices (not with fruit pulp)                                     milk, soups, orange juice  Iced Popsicles                                    All solid food Carbonated beverages, regular and diet                                    Cranberry, grape and apple juices Sports drinks like Gatorade Lightly seasoned clear broth or consume(fat free) Sugar, honey syrup  Sample Menu Breakfast                                Lunch                                     Supper Cranberry juice                    Beef broth  Chicken broth Jell-O                                     Grape juice                           Apple juice Coffee or tea                        Jell-O                                      Popsicle                                                Coffee or tea                        Coffee or tea  _____________________________________________________________________    PAIN IS EXPECTED AFTER SURGERY AND WILL NOT  BE COMPLETELY ELIMINATED. AMBULATION AND TYLENOL WILL HELP REDUCE INCISIONAL AND GAS PAIN. MOVEMENT IS KEY!  YOU ARE EXPECTED TO BE OUT OF BED WITHIN 4 HOURS OF ADMISSION TO YOUR PATIENT ROOM.  SITTING IN THE RECLINER THROUGHOUT THE DAY IS IMPORTANT FOR DRINKING FLUIDS AND MOVING GAS THROUGHOUT THE GI TRACT.  COMPRESSION STOCKINGS SHOULD BE WORN [place] UNLESS YOU ARE WALKING.   INCENTIVE SPIROMETER SHOULD BE USED EVERY HOUR WHILE AWAKE TO DECREASE POST-OPERATIVE COMPLICATIONS SUCH AS PNEUMONIA.  WHEN DISCHARGED HOME, IT IS IMPORTANT TO CONTINUE TO WALK EVERY HOUR AND USE THE INCENTIVE SPIROMETER EVERY HOUR.    How to Manage Your Diabetes Before and After Surgery  Why is it important to control my blood sugar before and after surgery? . Improving blood sugar levels before and after surgery helps healing and can limit problems. . A way of improving blood sugar control is eating a healthy diet by: o  Eating less sugar and carbohydrates o  Increasing activity/exercise o  Talking with your doctor about reaching your blood sugar goals . High blood sugars (greater than 180 mg/dL) can raise your risk of infections and slow your recovery, so you will need to focus on controlling your diabetes during the weeks before surgery. . Make sure that the doctor who takes care of your diabetes knows about your planned surgery including the date and location.  How do I manage my blood sugar before surgery? . Check your blood sugar at least 4 times a day, starting 2 days before surgery, to make sure that the level is not too high or low. o Check your blood sugar the morning of your surgery when you wake up and every 2 hours until you get to the Short Stay unit. . If your blood sugar is less than 70 mg/dL, you will need to treat for low blood sugar: o Do not take insulin. o Treat a low blood sugar (less than 70 mg/dL) with  cup of clear juice (cranberry or apple), 4 glucose tablets,  OR glucose gel. o Recheck blood sugar in 15 minutes after treatment (to make sure it is greater than 70 mg/dL). If your blood sugar is  not greater than 70 mg/dL on recheck, call [phone] for further instructions. . Report your blood sugar to the short stay nurse when you get to Short Stay.  . If you are admitted to the hospital after surgery: o Your blood sugar will be checked by the staff and you will probably be given insulin after surgery (instead of oral diabetes medicines) to make sure you have good blood sugar levels. o The goal for blood sugar control after surgery is 80-180 mg/dL.   WHAT DO I DO ABOUT MY DIABETES MEDICATION?Marland Kitchen   The day before surgery, take Liraglutide (Victoza) as usual.    . The day of surgery, do not take other diabetes injectables, including Byetta (exenatide), Bydureon (exenatide ER), Victoza (liraglutide), or Trulicity (dulaglutide).                   BRUSH YOUR TEETH MORNING OF SURGERY AND RINSE YOUR MOUTH OUT, NO CHEWING GUM CANDY OR MINTS.     Take these medicines the morning of surgery with A SIP OF WATER: Topiramate (Topamax), use Albuterol inhaler if needed and bring inhaler with you to the hospital               DO NOT [place] may not have any metal on your body including hair pins and              piercings  Do not wear jewelry, make-up, lotions, powders or perfumes, deodorant             Do not wear nail polish.  Do not shave  48 hours prior to surgery.              Do not bring valuables to the hospital. [place].  Contacts, dentures or bridgework may not be worn into surgery.  Leave suitcase in the car. After surgery it may be brought to your room.                  Please read over the following fact sheets you were given: _____________________________________________________________________              [facility] - Preparing for Surgery Before surgery, you can play an important role.  Because skin is not sterile, your skin needs to be as free of germs as possible.  You can reduce the number of germs on your skin by washing with CHG (chlorahexidine gluconate) soap before surgery.  CHG is an antiseptic cleaner which kills germs and bonds with the skin to continue killing germs even after washing. Please DO NOT use if you have an allergy to CHG or antibacterial soaps.  If your skin becomes reddened/irritated stop using the CHG and inform your nurse when you arrive at Short Stay. Do not shave (including legs and underarms) for at least 48 hours prior to the first CHG shower.  You may shave your face/neck. Please follow these instructions carefully:  1.  Shower with CHG Soap the night before surgery and the  morning of Surgery.  2.  If you choose to wash your hair, wash your hair first as usual with your  normal  shampoo.  3.  After you shampoo, rinse your hair and body thoroughly to remove the  shampoo.                           4.  Use CHG as you would any other liquid soap.  You can apply chg directly  to the skin and wash                       Gently with a scrungie or clean washcloth.  5.  Apply the CHG Soap to your body ONLY FROM THE NECK DOWN.   Do not use on face/ open                           Wound or open sores. Avoid contact with eyes, ears mouth and genitals (private parts).                       Wash face,  Genitals (private parts) with your normal soap.             6.  Wash thoroughly, paying special attention to the area where your surgery  will be performed.  7.  Thoroughly rinse your body with warm water from the neck down.  8.  DO NOT shower/wash with your normal soap after using and rinsing off  the CHG Soap.                9.  Pat yourself dry with a clean towel.            10.  Wear clean pajamas.            11.  Place clean sheets on your bed the night of your first shower and  do not  sleep with pets. Day of Surgery : Do not apply any lotions/deodorants the morning of surgery.  Please wear clean clothes to the hospital/surgery center.  FAILURE TO FOLLOW THESE INSTRUCTIONS MAY RESULT IN THE CANCELLATION OF YOUR SURGERY PATIENT SIGNATURE_________________________________  NURSE SIGNATURE__________________________________  ________________________________________________________________________   Adam Phenix  An incentive spirometer is a tool that can help keep your lungs clear and active. This tool measures how well you are filling your lungs with each breath. Taking long deep breaths may help reverse or decrease the chance of developing breathing (pulmonary) problems (especially infection) following:  A long period of time when you are unable to move or be active. BEFORE THE PROCEDURE   If the spirometer includes an indicator to show your best effort, your nurse or respiratory therapist will set it to a desired goal.  If possible, sit up straight or lean slightly forward. Try not to slouch.  Hold the incentive spirometer in an upright position. INSTRUCTIONS FOR USE  1. Sit on the edge of your bed if possible, or sit up as far as you can in bed or on a chair. 2. Hold the incentive spirometer in an upright position. 3. Breathe out normally. 4. Place the mouthpiece in your mouth and seal your lips tightly around it. 5. Breathe in slowly and as deeply as possible, raising the piston or the ball toward the top of the column. 6. Hold your breath for 3-5 seconds or for as long as possible. Allow the piston or ball to fall to the bottom of the column. 7. Remove the mouthpiece from your mouth and breathe out normally. 8. Rest for a  few seconds and repeat Steps 1 through 7 at least 10 times every 1-2 hours when you are awake. Take your time and take a few normal breaths between deep breaths. 9. The spirometer may include an indicator to show your best effort.  Use the indicator as a goal to work toward during each repetition. 10. After each set of 10 deep breaths, practice coughing to be sure your lungs are clear. If you have an incision (the cut made at the time of surgery), support your incision when coughing by placing a pillow or rolled up towels firmly against it. Once you are able to get out of bed, walk around indoors and cough well. You may stop using the incentive spirometer when instructed by your caregiver.  RISKS AND COMPLICATIONS  Take your time so you do not get dizzy or light-headed.  If you are in pain, you may need to take or ask for pain medication before doing incentive spirometry. It is harder to take a deep breath if you are having pain. AFTER USE  Rest and breathe slowly and easily.  It can be helpful to keep track of a log of your progress. Your caregiver can provide you with a simple table to help with this. If you are using the spirometer at home, follow these instructions: [place] IF:   You are having difficultly using the spirometer.  You have trouble using the spirometer as often as instructed.  Your pain medication is not giving enough relief while using the spirometer.  You develop fever of 100.5 F (38.1 C) or higher. SEEK IMMEDIATE MEDICAL CARE IF:   You cough up bloody sputum that had not been present before.  You develop fever of 102 F (38.9 C) or greater.  You develop worsening pain at or near the incision site. MAKE SURE YOU:   Understand these instructions.  Will watch your condition.  Will get help right away if you are not doing well or get worse. Document Released: 10/02/2006 Document Revised: 08/14/2011 Document Reviewed: 12/03/2006 [facility] Patient Information 2014 [place], [place].   ________________________________________________________________________

## 2018-08-08 ENCOUNTER — Encounter (HOSPITAL_COMMUNITY)
Admission: RE | Admit: 2018-08-08 | Discharge: 2018-08-08 | Disposition: A | Discharge status: Home / Self Care | Payer: 59 | Source: Ambulatory Visit | Attending: General Surgery | Admitting: General Surgery

## 2018-08-08 ENCOUNTER — Other Ambulatory Visit (HOSPITAL_COMMUNITY): Payer: Self-pay | Admitting: *Deleted

## 2018-08-08 DIAGNOSIS — Z01812 Encounter for preprocedural laboratory examination: Secondary | ICD-10-CM | POA: Diagnosis not present

## 2018-08-08 LAB — HEMOGLOBIN A1C
HEMOGLOBIN A1C: 4.8 % (ref 4.8–5.6)
Mean Plasma Glucose: 91.06 mg/dL

## 2018-08-08 LAB — ABO/RH: ABO/RH(D): O POS

## 2018-08-09 NOTE — Progress Notes (Signed)
Patient came by hospital and picked up the G2 lower sugar gatorade drink .  Patient aware to not drink the Ensure given but to drink the G2 at the appropriate time.

## 2018-08-11 MED ORDER — BUPIVACAINE LIPOSOME 1.3 % IJ SUSP
20.0000 mL | INTRAMUSCULAR | Status: DC
  Filled 2018-08-11: qty 20

## 2018-08-12 ENCOUNTER — Inpatient Hospital Stay (HOSPITAL_COMMUNITY)
Admission: RE | Admit: 2018-08-12 | Discharge: 2018-08-13 | DRG: 621 | Disposition: A | Discharge status: Home / Self Care | Payer: 59 | Attending: General Surgery | Admitting: General Surgery

## 2018-08-12 ENCOUNTER — Other Ambulatory Visit: Payer: Self-pay

## 2018-08-12 ENCOUNTER — Encounter (HOSPITAL_COMMUNITY): Payer: Self-pay | Admitting: Emergency Medicine

## 2018-08-12 ENCOUNTER — Encounter (HOSPITAL_COMMUNITY)
Admission: RE | Disposition: A | Discharge status: Home / Self Care | Payer: Self-pay | Source: Home / Self Care | Attending: General Surgery

## 2018-08-12 ENCOUNTER — Inpatient Hospital Stay (HOSPITAL_COMMUNITY): Payer: 59 | Admitting: Physician Assistant

## 2018-08-12 ENCOUNTER — Inpatient Hospital Stay (HOSPITAL_COMMUNITY): Payer: 59 | Admitting: Certified Registered"

## 2018-08-12 DIAGNOSIS — Z87891 Personal history of nicotine dependence: Secondary | ICD-10-CM | POA: Diagnosis not present

## 2018-08-12 DIAGNOSIS — Z79899 Other long term (current) drug therapy: Secondary | ICD-10-CM | POA: Diagnosis not present

## 2018-08-12 DIAGNOSIS — Z8249 Family history of ischemic heart disease and other diseases of the circulatory system: Secondary | ICD-10-CM

## 2018-08-12 DIAGNOSIS — K449 Diaphragmatic hernia without obstruction or gangrene: Secondary | ICD-10-CM | POA: Diagnosis present

## 2018-08-12 DIAGNOSIS — Z6841 Body Mass Index (BMI) 40.0 and over, adult: Secondary | ICD-10-CM | POA: Diagnosis not present

## 2018-08-12 DIAGNOSIS — F419 Anxiety disorder, unspecified: Secondary | ICD-10-CM | POA: Diagnosis present

## 2018-08-12 DIAGNOSIS — I10 Essential (primary) hypertension: Secondary | ICD-10-CM | POA: Diagnosis present

## 2018-08-12 DIAGNOSIS — K219 Gastro-esophageal reflux disease without esophagitis: Secondary | ICD-10-CM | POA: Diagnosis present

## 2018-08-12 DIAGNOSIS — G4730 Sleep apnea, unspecified: Secondary | ICD-10-CM | POA: Diagnosis present

## 2018-08-12 DIAGNOSIS — J302 Other seasonal allergic rhinitis: Secondary | ICD-10-CM | POA: Diagnosis present

## 2018-08-12 DIAGNOSIS — J4520 Mild intermittent asthma, uncomplicated: Secondary | ICD-10-CM | POA: Diagnosis present

## 2018-08-12 DIAGNOSIS — Z888 Allergy status to other drugs, medicaments and biological substances status: Secondary | ICD-10-CM

## 2018-08-12 DIAGNOSIS — E669 Obesity, unspecified: Secondary | ICD-10-CM | POA: Diagnosis present

## 2018-08-12 HISTORY — PX: LAPAROSCOPIC GASTRIC SLEEVE RESECTION: SHX5895

## 2018-08-12 LAB — HEMOGLOBIN AND HEMATOCRIT, BLOOD
HEMATOCRIT: 37.3 % (ref 36.0–46.0)
HEMOGLOBIN: 11.4 g/dL — AB (ref 12.0–15.0)

## 2018-08-12 LAB — TYPE AND SCREEN
ABO/RH(D): O POS
ANTIBODY SCREEN: NEGATIVE

## 2018-08-12 LAB — GLUCOSE, CAPILLARY: Glucose-Capillary: 132 mg/dL — ABNORMAL HIGH (ref 70–99)

## 2018-08-12 LAB — PREGNANCY, URINE: Preg Test, Ur: NEGATIVE

## 2018-08-12 SURGERY — GASTRECTOMY, SLEEVE, LAPAROSCOPIC
Anesthesia: General

## 2018-08-12 MED ORDER — SUGAMMADEX SODIUM 200 MG/2ML IV SOLN
INTRAVENOUS | Status: AC
  Filled 2018-08-12: qty 4

## 2018-08-12 MED ORDER — OXYCODONE HCL 5 MG/5ML PO SOLN: 5.0000 mg | Freq: Once | ORAL | Status: DC | PRN

## 2018-08-12 MED ORDER — PROMETHAZINE HCL 25 MG/ML IJ SOLN: 6.2500 mg | INTRAMUSCULAR | Status: DC | PRN

## 2018-08-12 MED ORDER — METOCLOPRAMIDE HCL 5 MG/ML IJ SOLN
INTRAMUSCULAR | Status: AC
  Filled 2018-08-12: qty 4

## 2018-08-12 MED ORDER — PHENYLEPHRINE 40 MCG/ML (10ML) SYRINGE FOR IV PUSH (FOR BLOOD PRESSURE SUPPORT)
PREFILLED_SYRINGE | INTRAVENOUS | Status: AC
  Filled 2018-08-12: qty 20

## 2018-08-12 MED ORDER — ALBUTEROL SULFATE HFA 108 (90 BASE) MCG/ACT IN AERS: 2.0000 | INHALATION_SPRAY | Freq: Four times a day (QID) | RESPIRATORY_TRACT | Status: DC | PRN

## 2018-08-12 MED ORDER — APREPITANT 40 MG PO CAPS
40.0000 mg | ORAL_CAPSULE | ORAL | Status: AC
  Administered 2018-08-12: 40 mg via ORAL
  Filled 2018-08-12: qty 1

## 2018-08-12 MED ORDER — BUPIVACAINE LIPOSOME 1.3 % IJ SUSP
INTRAMUSCULAR | Status: DC | PRN
  Administered 2018-08-12: 20 mL

## 2018-08-12 MED ORDER — PHENYLEPHRINE HCL 10 MG/ML IJ SOLN
INTRAMUSCULAR | Status: AC
  Filled 2018-08-12: qty 1

## 2018-08-12 MED ORDER — FAMOTIDINE IN NACL 20-0.9 MG/50ML-% IV SOLN
20.0000 mg | Freq: Two times a day (BID) | INTRAVENOUS | Status: DC
  Administered 2018-08-12 – 2018-08-13 (×2): 20 mg via INTRAVENOUS
  Filled 2018-08-12 (×2): qty 50

## 2018-08-12 MED ORDER — OXYCODONE HCL 5 MG/5ML PO SOLN
5.0000 mg | ORAL | Status: DC | PRN
  Administered 2018-08-12 (×2): 5 mg via ORAL
  Filled 2018-08-12 (×2): qty 5

## 2018-08-12 MED ORDER — SUCCINYLCHOLINE CHLORIDE 200 MG/10ML IV SOSY
PREFILLED_SYRINGE | INTRAVENOUS | Status: DC | PRN
  Administered 2018-08-12: 120 mg via INTRAVENOUS

## 2018-08-12 MED ORDER — PROPOFOL 10 MG/ML IV BOLUS
INTRAVENOUS | Status: AC
  Filled 2018-08-12: qty 20

## 2018-08-12 MED ORDER — DEXAMETHASONE SODIUM PHOSPHATE 10 MG/ML IJ SOLN
INTRAMUSCULAR | Status: AC
  Filled 2018-08-12: qty 5

## 2018-08-12 MED ORDER — ENOXAPARIN SODIUM 30 MG/0.3ML ~~LOC~~ SOLN
30.0000 mg | Freq: Two times a day (BID) | SUBCUTANEOUS | Status: DC
  Administered 2018-08-12 – 2018-08-13 (×2): 30 mg via SUBCUTANEOUS
  Filled 2018-08-12 (×2): qty 0.3

## 2018-08-12 MED ORDER — KETAMINE HCL 10 MG/ML IJ SOLN
INTRAMUSCULAR | Status: AC
  Filled 2018-08-12: qty 1

## 2018-08-12 MED ORDER — DEXAMETHASONE SODIUM PHOSPHATE 10 MG/ML IJ SOLN
INTRAMUSCULAR | Status: DC | PRN
  Administered 2018-08-12: 5 mg via INTRAVENOUS

## 2018-08-12 MED ORDER — LIDOCAINE 2% (20 MG/ML) 5 ML SYRINGE
INTRAMUSCULAR | Status: DC | PRN
  Administered 2018-08-12: 100 mg via INTRAVENOUS

## 2018-08-12 MED ORDER — CHLORHEXIDINE GLUCONATE 4 % EX LIQD: 60.0000 mL | Freq: Once | CUTANEOUS | Status: DC

## 2018-08-12 MED ORDER — LIDOCAINE 2% (20 MG/ML) 5 ML SYRINGE
INTRAMUSCULAR | Status: AC
  Filled 2018-08-12: qty 10

## 2018-08-12 MED ORDER — PHENYLEPHRINE 40 MCG/ML (10ML) SYRINGE FOR IV PUSH (FOR BLOOD PRESSURE SUPPORT)
PREFILLED_SYRINGE | INTRAVENOUS | Status: DC | PRN
  Administered 2018-08-12: 120 ug via INTRAVENOUS
  Administered 2018-08-12: 80 ug via INTRAVENOUS
  Administered 2018-08-12: 200 ug via INTRAVENOUS
  Administered 2018-08-12: 80 ug via INTRAVENOUS

## 2018-08-12 MED ORDER — SUGAMMADEX SODIUM 500 MG/5ML IV SOLN
INTRAVENOUS | Status: DC | PRN
  Administered 2018-08-12: 300 mg via INTRAVENOUS

## 2018-08-12 MED ORDER — SUGAMMADEX SODIUM 500 MG/5ML IV SOLN
INTRAVENOUS | Status: AC
  Filled 2018-08-12: qty 5

## 2018-08-12 MED ORDER — LACTATED RINGERS IR SOLN
Status: DC | PRN
  Administered 2018-08-12: 1000 mL

## 2018-08-12 MED ORDER — LIDOCAINE 20MG/ML (2%) 15 ML SYRINGE OPTIME
INTRAMUSCULAR | Status: DC | PRN
  Administered 2018-08-12: 1.5 mg/kg/h via INTRAVENOUS

## 2018-08-12 MED ORDER — SUCCINYLCHOLINE CHLORIDE 200 MG/10ML IV SOSY
PREFILLED_SYRINGE | INTRAVENOUS | Status: AC
  Filled 2018-08-12: qty 20

## 2018-08-12 MED ORDER — ACETAMINOPHEN 500 MG PO TABS
1000.0000 mg | ORAL_TABLET | ORAL | Status: AC
  Administered 2018-08-12: 1000 mg via ORAL

## 2018-08-12 MED ORDER — GABAPENTIN 100 MG PO CAPS
200.0000 mg | ORAL_CAPSULE | Freq: Two times a day (BID) | ORAL | Status: DC
  Administered 2018-08-12 – 2018-08-13 (×2): 200 mg via ORAL
  Filled 2018-08-12 (×3): qty 2

## 2018-08-12 MED ORDER — ROCURONIUM BROMIDE 100 MG/10ML IV SOLN
INTRAVENOUS | Status: AC
  Filled 2018-08-12: qty 2

## 2018-08-12 MED ORDER — HEPARIN SODIUM (PORCINE) 5000 UNIT/ML IJ SOLN
5000.0000 [IU] | INTRAMUSCULAR | Status: AC
  Administered 2018-08-12: 5000 [IU] via SUBCUTANEOUS
  Filled 2018-08-12: qty 1

## 2018-08-12 MED ORDER — MORPHINE SULFATE (PF) 4 MG/ML IV SOLN
1.0000 mg | INTRAVENOUS | Status: DC | PRN
  Administered 2018-08-12: 2 mg via INTRAVENOUS
  Filled 2018-08-12: qty 1

## 2018-08-12 MED ORDER — FENTANYL CITRATE (PF) 250 MCG/5ML IJ SOLN
INTRAMUSCULAR | Status: DC | PRN
  Administered 2018-08-12 (×3): 50 ug via INTRAVENOUS

## 2018-08-12 MED ORDER — EPHEDRINE 5 MG/ML INJ
INTRAVENOUS | Status: AC
  Filled 2018-08-12: qty 10

## 2018-08-12 MED ORDER — DEXTROSE-NACL 5-0.45 % IV SOLN
INTRAVENOUS | Status: DC
  Administered 2018-08-12 – 2018-08-13 (×4): via INTRAVENOUS

## 2018-08-12 MED ORDER — LIDOCAINE HCL 2 % IJ SOLN
INTRAMUSCULAR | Status: AC
  Filled 2018-08-12: qty 40

## 2018-08-12 MED ORDER — FENTANYL CITRATE (PF) 250 MCG/5ML IJ SOLN
INTRAMUSCULAR | Status: AC
  Filled 2018-08-12: qty 5

## 2018-08-12 MED ORDER — DEXAMETHASONE SODIUM PHOSPHATE 4 MG/ML IJ SOLN: 4.0000 mg | INTRAMUSCULAR | Status: DC

## 2018-08-12 MED ORDER — MIDAZOLAM HCL 2 MG/2ML IJ SOLN
INTRAMUSCULAR | Status: AC
  Filled 2018-08-12: qty 2

## 2018-08-12 MED ORDER — PROPOFOL 10 MG/ML IV BOLUS
INTRAVENOUS | Status: DC | PRN
  Administered 2018-08-12: 200 mg via INTRAVENOUS

## 2018-08-12 MED ORDER — GABAPENTIN 300 MG PO CAPS: 300.0000 mg | ORAL_CAPSULE | ORAL | Status: DC

## 2018-08-12 MED ORDER — SODIUM CHLORIDE 0.9 % IV SOLN
2.0000 g | INTRAVENOUS | Status: AC
  Administered 2018-08-12: 2 g via INTRAVENOUS
  Filled 2018-08-12: qty 2

## 2018-08-12 MED ORDER — ROCURONIUM BROMIDE 10 MG/ML (PF) SYRINGE
PREFILLED_SYRINGE | INTRAVENOUS | Status: DC | PRN
  Administered 2018-08-12: 50 mg via INTRAVENOUS

## 2018-08-12 MED ORDER — ACETAMINOPHEN 500 MG PO TABS
1000.0000 mg | ORAL_TABLET | ORAL | Status: DC
  Filled 2018-08-12: qty 2

## 2018-08-12 MED ORDER — ONDANSETRON HCL 4 MG/2ML IJ SOLN: 4.0000 mg | INTRAMUSCULAR | Status: DC | PRN

## 2018-08-12 MED ORDER — KETAMINE HCL 10 MG/ML IJ SOLN
INTRAMUSCULAR | Status: DC | PRN
  Administered 2018-08-12: 20 mg via INTRAVENOUS

## 2018-08-12 MED ORDER — OXYCODONE HCL 5 MG PO TABS: 5.0000 mg | ORAL_TABLET | Freq: Once | ORAL | Status: DC | PRN

## 2018-08-12 MED ORDER — HYDRALAZINE HCL 20 MG/ML IJ SOLN: 10.0000 mg | INTRAMUSCULAR | Status: DC | PRN

## 2018-08-12 MED ORDER — SCOPOLAMINE 1 MG/3DAYS TD PT72
1.0000 | MEDICATED_PATCH | TRANSDERMAL | Status: DC
  Administered 2018-08-12: 1.5 mg via TRANSDERMAL
  Filled 2018-08-12: qty 1

## 2018-08-12 MED ORDER — BUPIVACAINE HCL (PF) 0.25 % IJ SOLN
INTRAMUSCULAR | Status: AC
  Filled 2018-08-12: qty 30

## 2018-08-12 MED ORDER — LACTATED RINGERS IV SOLN
INTRAVENOUS | Status: DC
  Administered 2018-08-12 (×2): via INTRAVENOUS

## 2018-08-12 MED ORDER — MEPERIDINE HCL 50 MG/ML IJ SOLN: 6.2500 mg | INTRAMUSCULAR | Status: DC | PRN

## 2018-08-12 MED ORDER — ONDANSETRON HCL 4 MG/2ML IJ SOLN
INTRAMUSCULAR | Status: DC | PRN
  Administered 2018-08-12: 4 mg via INTRAVENOUS

## 2018-08-12 MED ORDER — PHENYLEPHRINE 40 MCG/ML (10ML) SYRINGE FOR IV PUSH (FOR BLOOD PRESSURE SUPPORT)
PREFILLED_SYRINGE | INTRAVENOUS | Status: AC
  Filled 2018-08-12: qty 10

## 2018-08-12 MED ORDER — ENSURE MAX PROTEIN PO LIQD
2.0000 [oz_av] | ORAL | Status: DC
  Administered 2018-08-13 (×4): 2 [oz_av] via ORAL

## 2018-08-12 MED ORDER — MIDAZOLAM HCL 2 MG/2ML IJ SOLN
INTRAMUSCULAR | Status: DC | PRN
  Administered 2018-08-12 (×2): 1 mg via INTRAVENOUS

## 2018-08-12 MED ORDER — HYDROMORPHONE HCL 1 MG/ML IJ SOLN: 0.2500 mg | INTRAMUSCULAR | Status: DC | PRN

## 2018-08-12 MED ORDER — ALBUTEROL SULFATE (2.5 MG/3ML) 0.083% IN NEBU: 2.5000 mg | INHALATION_SOLUTION | Freq: Four times a day (QID) | RESPIRATORY_TRACT | Status: DC | PRN

## 2018-08-12 MED ORDER — GABAPENTIN 300 MG PO CAPS
300.0000 mg | ORAL_CAPSULE | ORAL | Status: AC
  Administered 2018-08-12: 300 mg via ORAL
  Filled 2018-08-12: qty 1

## 2018-08-12 MED ORDER — SIMETHICONE 80 MG PO CHEW
80.0000 mg | CHEWABLE_TABLET | Freq: Four times a day (QID) | ORAL | Status: DC | PRN
  Administered 2018-08-13: 80 mg via ORAL
  Filled 2018-08-12: qty 1

## 2018-08-12 MED ORDER — ACETAMINOPHEN 160 MG/5ML PO SOLN
650.0000 mg | Freq: Four times a day (QID) | ORAL | Status: DC
  Administered 2018-08-12 – 2018-08-13 (×3): 650 mg via ORAL
  Filled 2018-08-12 (×3): qty 20.3

## 2018-08-12 MED ORDER — ONDANSETRON HCL 4 MG/2ML IJ SOLN
INTRAMUSCULAR | Status: AC
  Filled 2018-08-12: qty 8

## 2018-08-12 MED ORDER — BUPIVACAINE HCL 0.25 % IJ SOLN
INTRAMUSCULAR | Status: DC | PRN
  Administered 2018-08-12: 30 mL

## 2018-08-12 SURGICAL SUPPLY — 60 items
APL SKNCLS STERI-STRIP NONHPOA (GAUZE/BANDAGES/DRESSINGS) ×1
APPLIER CLIP ROT 13.4 12 LRG (CLIP) ×3
APR CLP LRG 13.4X12 ROT 20 MLT (CLIP) ×1
BAG LAPAROSCOPIC 12 15 PORT 16 (BASKET) ×1 IMPLANT
BAG RETRIEVAL 12/15 (BASKET) ×2
BAG RETRIEVAL 12/15MM (BASKET) ×1
BANDAGE ADH SHEER 1  50/CT (GAUZE/BANDAGES/DRESSINGS) ×18 IMPLANT
BENZOIN TINCTURE PRP APPL 2/3 (GAUZE/BANDAGES/DRESSINGS) ×3 IMPLANT
BLADE SURG SZ11 CARB STEEL (BLADE) ×3 IMPLANT
CABLE HIGH FREQUENCY MONO STRZ (ELECTRODE) ×2 IMPLANT
CHLORAPREP W/TINT 26ML (MISCELLANEOUS) ×3 IMPLANT
CLIP APPLIE ROT 13.4 12 LRG (CLIP) IMPLANT
CLOSURE WOUND 1/2 X4 (GAUZE/BANDAGES/DRESSINGS) ×1
COVER SURGICAL LIGHT HANDLE (MISCELLANEOUS) ×3 IMPLANT
COVER WAND RF STERILE (DRAPES) IMPLANT
DRAPE UTILITY XL STRL (DRAPES) ×6 IMPLANT
ELECT REM PT RETURN 15FT ADLT (MISCELLANEOUS) ×3 IMPLANT
GLOVE BIOGEL PI IND STRL 7.0 (GLOVE) ×1 IMPLANT
GLOVE BIOGEL PI INDICATOR 7.0 (GLOVE) ×2
GLOVE SURG SS PI 7.0 STRL IVOR (GLOVE) ×3 IMPLANT
GOWN STRL REUS W/TWL LRG LVL3 (GOWN DISPOSABLE) ×3 IMPLANT
GOWN STRL REUS W/TWL XL LVL3 (GOWN DISPOSABLE) ×9 IMPLANT
GRASPER SUT TROCAR 14GX15 (MISCELLANEOUS) ×3 IMPLANT
HOVERMATT SINGLE USE (MISCELLANEOUS) IMPLANT
KIT BASIN OR (CUSTOM PROCEDURE TRAY) ×3 IMPLANT
KIT TURNOVER KIT A (KITS) IMPLANT
MARKER SKIN DUAL TIP RULER LAB (MISCELLANEOUS) ×3 IMPLANT
NDL SPNL 22GX3.5 QUINCKE BK (NEEDLE) ×1 IMPLANT
NEEDLE SPNL 22GX3.5 QUINCKE BK (NEEDLE) ×3 IMPLANT
PACK UNIVERSAL I (CUSTOM PROCEDURE TRAY) ×3 IMPLANT
RELOAD STAPLE 60 3.6 BLU REG (STAPLE) IMPLANT
RELOAD STAPLE 60 3.8 GOLD REG (STAPLE) IMPLANT
RELOAD STAPLE 60 4.1 GRN THCK (STAPLE) IMPLANT
RELOAD STAPLER BLUE 60MM (STAPLE) ×3 IMPLANT
RELOAD STAPLER GOLD 60MM (STAPLE) ×1 IMPLANT
RELOAD STAPLER GREEN 60MM (STAPLE) ×1 IMPLANT
SCISSORS LAP 5X45 EPIX DISP (ENDOMECHANICALS) ×2 IMPLANT
SET IRRIG TUBING LAPAROSCOPIC (IRRIGATION / IRRIGATOR) ×3 IMPLANT
SET TUBE SMOKE EVAC HIGH FLOW (TUBING) ×3 IMPLANT
SHEARS HARMONIC ACE PLUS 45CM (MISCELLANEOUS) ×3 IMPLANT
SLEEVE GASTRECTOMY 40FR VISIGI (MISCELLANEOUS) ×3 IMPLANT
SLEEVE XCEL OPT CAN 5 100 (ENDOMECHANICALS) ×6 IMPLANT
SOLUTION ANTI FOG 6CC (MISCELLANEOUS) ×3 IMPLANT
SPONGE LAP 18X18 RF (DISPOSABLE) ×3 IMPLANT
STAPLER ECHELON LONG 60 440 (INSTRUMENTS) ×3 IMPLANT
STAPLER RELOAD BLUE 60MM (STAPLE) ×9
STAPLER RELOAD GOLD 60MM (STAPLE) ×3
STAPLER RELOAD GREEN 60MM (STAPLE) ×3
STRIP CLOSURE SKIN 1/2X4 (GAUZE/BANDAGES/DRESSINGS) ×2 IMPLANT
SUT ETHIBOND 0 36 GRN (SUTURE) ×2 IMPLANT
SUT MNCRL AB 4-0 PS2 18 (SUTURE) ×3 IMPLANT
SUT VICRYL 0 TIES 12 18 (SUTURE) ×3 IMPLANT
SYR 20CC LL (SYRINGE) ×3 IMPLANT
SYR 50ML LL SCALE MARK (SYRINGE) ×3 IMPLANT
TOWEL OR 17X26 10 PK STRL BLUE (TOWEL DISPOSABLE) ×3 IMPLANT
TOWEL OR NON WOVEN STRL DISP B (DISPOSABLE) ×3 IMPLANT
TROCAR BLADELESS 15MM (ENDOMECHANICALS) ×3 IMPLANT
TROCAR BLADELESS OPT 5 100 (ENDOMECHANICALS) ×3 IMPLANT
TUBING CONNECTING 10 (TUBING) ×2 IMPLANT
TUBING CONNECTING 10' (TUBING) ×1

## 2018-08-12 NOTE — Anesthesia Procedure Notes (Signed)
Date/Time: 08/12/2018 12:05 PM Performed by: Cynda Familia, CRNA Oxygen Delivery Method: Simple face mask Placement Confirmation: positive ETCO2 and breath sounds checked- equal and bilateral Dental Injury: Teeth and Oropharynx as per pre-operative assessment

## 2018-08-12 NOTE — H&P (Signed)
Michelle Trevino is an 48 y.o. female.   Chief Complaint: obesity HPI: 48 yo female with class III obesity. She has completed all requirements and is now ready to proceed with bariatric surgery.  Past Medical History:  Diagnosis Date  . Anxiety   . Enlarged heart   . Fatigue   . GERD (gastroesophageal reflux disease)   . Gestational diabetes   . Heart murmur   . Hemorrhoids   . History of cocaine dependence ([place]), in remissioin 09/29/2009   no issues in 16 years  . Hypertension   . Infertility, female 12/12/2013  . Migraines   . Mild intermittent asthma   . Nerve damage of right foot    R/T lipoma  . Seasonal allergies     Past Surgical History:  Procedure Laterality Date  . FLUOROSCOPIC TUBAL RECANNULATUON  2011  . FOOT SURGERY Bilateral   . LIPOMA RESECTION Left 11/2012   ANKLE  . LIPOSUCTION     5 years ago  . MOUTH SURGERY     age 82  . TUBAL LIGATION  2000    Family History  Problem Relation Age of Onset  . Diabetes Mother   . Hypertension Mother   . Osteoarthritis Mother   . Stroke Mother 40  . Hepatitis Mother   . Stomach cancer Father 22  . Stroke Brother 40  . Cancer Maternal Grandmother   . Colon cancer Neg Hx   . Esophageal cancer Neg Hx   . Rectal cancer Neg Hx    Social History:  reports that she quit smoking about 2 months ago. Her smoking use included cigarettes. She has a 5.00 pack-year smoking history. She has never used smokeless tobacco. She reports current alcohol use of about 1.0 standard drinks of alcohol per week. She reports that she does not use drugs.  Allergies:  Allergies  Allergen Reactions  . Lisinopril     cough    Medications Prior to Admission  Medication Sig Dispense Refill  . albuterol (PROVENTIL HFA;VENTOLIN HFA) 108 (90 Base) MCG/ACT inhaler Inhale 2 puffs into the lungs every 6 (six) hours as needed for wheezing or shortness of breath.     . Liraglutide -Weight Management (SAXENDA) 18 MG/3ML SOPN Inject 3 mg into the  skin daily. Per box instructions. 4 pen 6  . Multiple Vitamin (MULTIVITAMIN WITH MINERALS) TABS tablet Take 1 tablet by mouth daily.    Marland Kitchen spironolactone (ALDACTONE) 50 MG tablet Take 1 tablet (50 mg total) by mouth daily. 30 tablet 2  . topiramate (TOPAMAX) 50 MG tablet Take 1 tablet (50 mg total) by mouth 2 (two) times daily. 60 tablet 3    Results for orders placed or performed during the hospital encounter of 08/12/18 (from the past 48 hour(s))  Pregnancy, urine STAT morning of surgery     Status: None   Collection Time: 08/12/18  8:25 AM  Result Value Ref Range   Preg Test, Ur NEGATIVE NEGATIVE    Comment:        THE SENSITIVITY OF THIS METHODOLOGY IS >20 mIU/mL. Performed at [facility], [place] [address], [place],  [address]   Glucose, capillary     Status: Abnormal   Collection Time: 08/12/18  8:50 AM  Result Value Ref Range   Glucose-Capillary 132 (H) 70 - 99 mg/dL   Comment 1 Notify RN    No results found.  Review of Systems  Constitutional: Negative for chills and fever.  HENT: Negative for hearing loss.  Eyes: Negative for blurred vision and double vision.  Respiratory: Negative for cough and hemoptysis.   Cardiovascular: Negative for chest pain and palpitations.  Gastrointestinal: Negative for abdominal pain, nausea and vomiting.  Genitourinary: Negative for dysuria and urgency.  Musculoskeletal: Negative for myalgias and neck pain.  Skin: Negative for itching and rash.  Neurological: Negative for dizziness, tingling and headaches.  Endo/Heme/Allergies: Does not bruise/bleed easily.  Psychiatric/Behavioral: Negative for depression and suicidal ideas.    Blood pressure 129/79, pulse 77, temperature 98.5 F (36.9 C), temperature source Oral, resp. rate 18, height 5' 1.5" (1.562 m), weight 105.3 kg, last menstrual period 07/11/2018, SpO2 100 %. Physical Exam  Vitals reviewed. Constitutional: She is oriented to person, place, and time. She  appears well-developed and well-nourished.  HENT:  Head: Normocephalic and atraumatic.  Eyes: Pupils are equal, round, and reactive to light. Conjunctivae and EOM are normal.  Neck: Normal range of motion. Neck supple.  Cardiovascular: Normal rate and regular rhythm.  Respiratory: Effort normal and breath sounds normal.  GI: Soft. Bowel sounds are normal. She exhibits no distension. There is no abdominal tenderness.  Musculoskeletal: Normal range of motion.  Neurological: She is alert and oriented to person, place, and time.  Skin: Skin is warm and dry.  Psychiatric: She has a normal mood and affect. Her behavior is normal.     Assessment/Plan 48 yo female with class III obesity and small hiatal hernia on UGI -lap sleeve gastrectomy -hiatal hernia repair -admit to bariatric floor with protocol post op  Michelle Skinner, MD 08/12/2018, 10:06 AM

## 2018-08-12 NOTE — Op Note (Signed)
Preop Diagnosis: Obesity Class III  Postop Diagnosis: same  Procedure performed: laparoscopic Sleeve Gastrectomy  Assitant: Greer Pickerel  Indications:  The patient is a 48 y.o. year-old morbidly obese female who has been followed in the Bariatric Clinic as an outpatient. This patient was diagnosed with morbid obesity with a BMI of Body mass index is 43.16 kg/m. and significant co-morbidities including hypertension.  The patient was counseled extensively in the Bariatric Outpatient Clinic and after a thorough explanation of the risks and benefits of surgery (including death from complications, bowel leak, infection such as peritonitis and/or sepsis, internal hernia, bleeding, need for blood transfusion, bowel obstruction, organ failure, pulmonary embolus, deep venous thrombosis, wound infection, incisional hernia, skin breakdown, and others entailed on the consent form) and after a compliant diet and exercise program, the patient was scheduled for an elective laparoscopic sleeve gastrectomy.  Description of Operation:  Following informed consent, the patient was taken to the operating room and placed on the operating table in the supine position.  She had previously received prophylactic antibiotics and subcutaneous heparin for DVT prophylaxis in the pre-op holding area.  After induction of general endotracheal anesthesia by the anesthesiologist, the patient underwent placement of sequential compression devices and an oro-gastric tube.  A timeout was confirmed by the surgery and anesthesia teams.  The patient was adequately padded at all pressure points and placed on a footboard to prevent slippage from the OR table during extremes of position during surgery.  She underwent a routine sterile prep and drape of her entire abdomen.    Next, A transverse incision was made under the left subcostal area and a 29mm optical viewing trocar was introduced into the peritoneal cavity. Pneumoperitoneum was applied  with a high flow and low pressure. A laparoscope was inserted to confirm placement. A extraperitoneal block was then placed at the lateral abdominal wall using exparel diluted with marcaine. 5 additional incisions were placed: 1 37mm trocar to the left of the midline. 1 additional 35mm trocar in the left lateral area, 1 37mm trocar in the right mid abdomen, 1 7mm trocar in the right subcostal area, and a Nathanson retractor was placed through a subxiphoid incision.  The UGI showed a small hiatal hernia. Therefore, the pars flaccida was incised with harmonic scalpel. The stomach was reduced but on dissection of the posterior crus there was a visible hernia with small sac. The sac was dissected free and 1 0 ethibond sutures placed in interrupted fashion. A calibration tube was passed to ensure appropriate size of the hiatus.   The fat pad at the GE junction was incised and the gastrodiaphragmatic ligament was divided using the Harmonic scalpel. Next, a hole was created through the lesser omentum along the greater curve of the stomach to enter the lesser sac. The vessels along the greater omentum were  Then ligated and divided using the Harmonic scalpel moving towards the spleen and then short gastric vessels were ligated and divided in the same fashion to fully mobilize the fundus. The left crus was identified to ensure completion of the dissection. Next the antrum was measured and dissection continued inferiorly along the greater curve towards the pylorus and stopped 6cm from the pylorus.   A 40Fr ViSiGi dilator was placed into the esophgaus and along the lesser curve of the stomach and placed on suction. 1 non-reinforced 81mm Green load echelon stapler(s) followed by 1 34mm Gold load echelon stapler(s) followed by 3 16mm blue load echelon stapler(s) were used to make the  resection along the antrum being sure to stay well away from the angularis by angling the jaws of the stapler towards the greater curve and  later completing the resection staying along the [place] and ensuring the fundus was not retained by appropriately retracting it lateral. Air was inserted through the [place] to perform a leak test showing no bubbles and a neutral lie of the stomach.  The assistant then went and performed an upper endoscopy and leak test. No bubbles were seen and the sleeve and antrum distended appropriately. The specimen was then placed in an endocatch bag and removed by the 82mm port. The fascia of the 110mm port was closed with a 0 vicryl by suture passer. There was one bleeding area at the start of the staple line, 2 clips were used for hemostasis. Hemostasis was ensured for the rest of the dissection area. Pneumoperitoneum was evacuated, all ports were removed and all incisions closed with 4-0 monocryl suture in subcuticular fashion. Steristrips and bandaids were put in place for dressing. The patient awoke from anesthesia and was brought to pacu in stable condition. All counts were correct.  Estimated blood loss: 72ml  Specimens:  Sleeve gastrectomy  Local Anesthesia: 50 ml Exparel:0.5% Marcaine mix  Post-Op Plan:       Pain Management: PO, prn      Antibiotics: Prophylactic      Anticoagulation: Prophylactic, Starting now      Post Op Studies/Consults: Not applicable      Intended Discharge: within 48h      Intended Outpatient Follow-Up: Two Week      Intended Outpatient Studies: Not Applicable      Other: Not Applicable  Images:        Arta Bruce Kinsinger

## 2018-08-12 NOTE — Discharge Instructions (Signed)
° ° ° °GASTRIC BYPASS/SLEEVE ° Home Care Instructions ° ° These instructions are to help you care for yourself when you go home. ° °Call: If you have any problems. °• Call [phone] and ask for the surgeon on call °• If you need immediate help, come to the ER at [facility].  °• Tell the ER staff that you are a new post-op gastric bypass or gastric sleeve patient °  °Signs and symptoms to report: • Severe vomiting or nausea °o If you cannot keep down clear liquids for longer than 1 day, call your surgeon  °• Abdominal pain that does not get better after taking your pain medication °• Fever over 100.4° F with chills °• Heart beating over 100 beats a minute °• Shortness of breath at rest °• Chest pain °•  Redness, swelling, drainage, or foul odor at incision (surgical) sites °•  If your incisions open or pull apart °• Swelling or pain in calf (lower leg) °• Diarrhea (Loose bowel movements that happen often), frequent watery, uncontrolled bowel movements °• Constipation, (no bowel movements for 3 days) if this happens: Pick one °o Milk of Magnesia, 2 tablespoons by mouth, 3 times a day for 2 days if needed °o Stop taking Milk of Magnesia once you have a bowel movement °o Call your doctor if constipation continues °Or °o Miralax  (instead of Milk of Magnesia) following the label instructions °o Stop taking Miralax once you have a bowel movement °o Call your doctor if constipation continues °• Anything you think is not normal °  °Normal side effects after surgery: • Unable to sleep at night or unable to focus °• Irritability or moody °• Being tearful (crying) or depressed °These are common complaints, possibly related to your anesthesia medications that put you to sleep, stress of surgery, and change in lifestyle.  This usually goes away a few weeks after surgery.  If these feelings continue, call your primary care doctor. °  °Wound Care: You may have surgical glue, steri-strips, or staples over your incisions after  surgery °• Surgical glue:  Looks like a clear film over your incisions and will wear off a little at a time °• Steri-strips: Strips of tape over your incisions. You may notice a yellowish color on the skin under the steri-strips. This is used to make the   steri-strips stick better. Do not pull the steri-strips off - let them fall off °• Staples: Staples may be removed before you leave the hospital °o If you go home with staples, call [facility], [phone] at for an appointment with your surgeon’s nurse to have staples removed 10 days after surgery. °• Showering: You may shower two (2) days after your surgery unless your surgeon tells you differently °o Wash gently around incisions with warm soapy water, rinse well, and gently pat dry  °o No tub baths until staples are removed, steri-strips fall off or glue is gone.  °  °Medications: • Medications should be liquid or crushed if larger than the size of a dime °• Extended release pills (medication that release a little bit at a time through the day) should NOT be crushed or cut. (examples include XL, ER, DR, SR) °• Depending on the size and number of medications you take, you may need to space (take a few throughout the day)/change the time you take your medications so that you do not over-fill your pouch (smaller stomach) °• Make sure you follow-up with your primary care doctor to   make medication changes needed during rapid weight loss and life-style changes °• If you have diabetes, follow up with the doctor that orders your diabetes medication(s) within one week after surgery and check your blood sugar regularly. °• Do not drive while taking prescription pain medication  °• It is ok to take Tylenol by the bottle instructions with your pain medicine or instead of your pain medicine as needed.  DO NOT TAKE NSAIDS (EXAMPLES OF NSAIDS:  IBUPROFREN/ NAPROXEN)  °Diet:                    First 2 Weeks ° You will see the dietician t about two (2) weeks  after your surgery. The dietician will increase the types of foods you can eat if you are handling liquids well: °• If you have severe vomiting or nausea and cannot keep down clear liquids lasting longer than 1 day, call your surgeon @ ([phone]) °Protein Shake °• Drink at least 2 ounces of shake 5-6 times per day °• Each serving of protein shakes (usually 8 - 12 ounces) should have: °o 15 grams of protein  °o And no more than 5 grams of carbohydrate  °• Goal for protein each day: °o Men = 80 grams per day °o Women = 60 grams per day °• Protein powder may be added to fluids such as non-fat milk or Lactaid milk or unsweetened Soy/Almond milk (limit to 35 grams added protein powder per serving) ° °Hydration °• Slowly increase the amount of water and other clear liquids as tolerated (See Acceptable Fluids) °• Slowly increase the amount of protein shake as tolerated  °•  Sip fluids slowly and throughout the day.  Do not use straws. °• May use sugar substitutes in small amounts (no more than 6 - 8 packets per day; i.e. Splenda) ° °Fluid Goal °• The first goal is to drink at least 8 ounces of protein shake/drink per day (or as directed by the nutritionist); some examples of protein shakes are Syntrax Nectar, Adkins Advantage, EAS Edge HP, and Unjury. See handout from pre-op Bariatric Education Class: °o Slowly increase the amount of protein shake you drink as tolerated °o You may find it easier to slowly sip shakes throughout the day °o It is important to get your proteins in first °• Your fluid goal is to drink 64 - 100 ounces of fluid daily °o It may take a few weeks to build up to this °• 32 oz (or more) should be clear liquids  °And  °• 32 oz (or more) should be full liquids (see below for examples) °• Liquids should not contain sugar, caffeine, or carbonation ° °Clear Liquids: °• Water or Sugar-free flavored water (i.e. Fruit H2O, Propel) °• Decaffeinated coffee or tea (sugar-free) °• Crystal Lite, Wyler’s Lite,  Minute Maid Lite °• Sugar-free Jell-O °• Bouillon or broth °• Sugar-free Popsicle:   *Less than 20 calories each; Limit 1 per day ° °Full Liquids: °Protein Shakes/Drinks + 2 choices per day of other full liquids °• Full liquids must be: °o No More Than 15 grams of Carbs per serving  °o No More Than 3 grams of Fat per serving °• Strained low-fat cream soup (except Cream of Potato or Tomato) °• Non-Fat milk °• Fat-free Lactaid Milk °• Unsweetened Soy Or Unsweetened Almond Milk °• Low Sugar yogurt (Dannon Lite & Fit, Greek yogurt; Oikos Triple Zero; Chobani Simply 100; Yoplait 100 calorie Greek - No Fruit on the Bottom) ° °  °Vitamins   and Minerals • Start 1 day after surgery unless otherwise directed by your surgeon °• 2 Chewable Bariatric Specific Multivitamin / Multimineral Supplement with iron (Example: Bariatric Advantage Multi EA) °• Chewable Calcium with Vitamin D-3 °(Example: 3 Chewable Calcium Plus 600 with Vitamin D-3) °o Take 500 mg three (3) times a day for a total of 1500 mg each day °o Do not take all 3 doses of calcium at one time as it may cause constipation, and you can only absorb 500 mg  at a time  °o Do not mix multivitamins containing iron with calcium supplements; take 2 hours apart °• Menstruating women and those with a history of anemia (a blood disease that causes weakness) may need extra iron °o Talk with your doctor to see if you need more iron °• Do not stop taking or change any vitamins or minerals until you talk to your dietitian or surgeon °• Your Dietitian and/or surgeon must approve all vitamin and mineral supplements °  °Activity and Exercise: Limit your physical activity as instructed by your doctor.  It is important to continue walking at home.  During this time, use these guidelines: °• Do not lift anything greater than ten (10) pounds for at least two (2) weeks °• Do not go back to work or drive until your surgeon says you can °• You may have sex when you feel comfortable  °o It is  VERY important for female patients to use a reliable birth control method; fertility often increases after surgery  °o All hormonal birth control will be ineffective for 30 days after surgery due to medications given during surgery a barrier method must be used. °o Do not get pregnant for at least 18 months °• Start exercising as soon as your doctor tells you that you can °o Make sure your doctor approves any physical activity °• Start with a simple walking program °• Walk 5-15 minutes each day, 7 days per week.  °• Slowly increase until you are walking 30-45 minutes per day °Consider joining our BELT program. [phone] or email [email or web address] °  °Special Instructions Things to remember: °• Use your CPAP when sleeping if this applies to you ° °• [facility] has two free Bariatric Surgery Support Groups that meet monthly °o The 3rd Thursday of each month, 6 pm, [facility]  °o The 2nd Friday of each month, 11:45 am in the private dining room in the basement of [facility] °• It is very important to keep all follow up appointments with your surgeon, dietitian, primary care physician, and behavioral health practitioner °• Routine follow up schedule with your surgeon include appointments at 2-3 weeks, 6-8 weeks, 6 months, and 1 year at a minimum.  Your surgeon may request to see you more often.   °o After the first year, please follow up with your bariatric surgeon and dietitian at least once a year in order to maintain best weight loss results °[facility]: [phone] °[facility]: [phone] °Bariatric Nurse Coordinator: [phone] °  °   Reviewed and Endorsed  °by  Patient Education Committee, June, 2016 °Edits Approved: Aug, 2018 ° ° ° °

## 2018-08-12 NOTE — Op Note (Signed)
Nycole Kawahara [id number] [birth year] 08/12/2018  Preoperative diagnosis: severe obesity  Postoperative diagnosis: Same   Procedure: upper endoscopy   Surgeon: Leighton Ruff. Toneshia Coello M.D., FACS   Anesthesia: Gen.   Indications for procedure: 48 y.o. year old female undergoing Laparoscopic Gastric Sleeve Resection and an EGD was requested to evaluate the new gastric sleeve.   Description of procedure: After we have completed the sleeve resection, I scrubbed out and obtained the Olympus endoscope. I gently placed endoscope in the patient's oropharynx and gently glided it down the esophagus without any difficulty under direct visualization. Once I was in the gastric sleeve, I insufflated the stomach with air. I was able to cannulate and advanced the scope through the gastric sleeve. I was able to cannulate the duodenum with ease. Dr. Kieth Brightly had placed saline in the upper abdomen. Upon further insufflation of the gastric sleeve there was no evidence of bubbles. GE junction located at 36 cm.  Upon further inspection of the gastric sleeve, the mucosa appeared normal. There is no evidence of any mucosal abnormality. The sleeve was widely patent at the angularis. There was no evidence of bleeding. The gastric sleeve was decompressed. The scope was withdrawn. The patient tolerated this portion of the procedure well. Please see Dr Amie Portland operative note for details regarding the laparoscopic gastric sleeve resection.   Leighton Ruff. Redmond Pulling, MD, FACS  General, Bariatric, & Minimally Invasive Surgery  [facility], [place]

## 2018-08-12 NOTE — Anesthesia Preprocedure Evaluation (Addendum)
Anesthesia Evaluation  Patient identified by MRN, date of birth, ID band Patient awake    Reviewed: Allergy & Precautions, NPO status , Patient's Chart, lab work & pertinent test results  Airway Mallampati: II  TM Distance: >3 FB Neck ROM: Full    Dental no notable dental hx. (+) Dental Advisory Given, Chipped, Missing, Poor Dentition   Pulmonary asthma , sleep apnea , former smoker,    Pulmonary exam normal breath sounds clear to auscultation       Cardiovascular hypertension, negative cardio ROS Normal cardiovascular exam Rhythm:Regular Rate:Normal     Neuro/Psych  Headaches, Anxiety negative psych ROS   GI/Hepatic Neg liver ROS, GERD  ,  Endo/Other  diabetesMorbid obesity  Renal/GU negative Renal ROS  negative genitourinary   Musculoskeletal  (+) Arthritis , Osteoarthritis,    Abdominal (+) + obese,   Peds negative pediatric ROS (+)  Hematology negative hematology ROS (+)   Anesthesia Other Findings   Reproductive/Obstetrics negative OB ROS                            Anesthesia Physical Anesthesia Plan  ASA: III  Anesthesia Plan: General   Post-op Pain Management:    Induction: Intravenous  PONV Risk Score and Plan: 3 and Ondansetron, Dexamethasone and Midazolam  Airway Management Planned: Oral ETT  Additional Equipment:   Intra-op Plan:   Post-operative Plan: Extubation in OR  Informed Consent: I have reviewed the patients History and Physical, chart, labs and discussed the procedure including the risks, benefits and alternatives for the proposed anesthesia with the patient or authorized representative who has indicated his/her understanding and acceptance.     Dental advisory given  Plan Discussed with: CRNA  Anesthesia Plan Comments:         Anesthesia Quick Evaluation

## 2018-08-12 NOTE — Progress Notes (Signed)
PHARMACY CONSULT FOR:  Risk Assessment for Post-Discharge VTE Following Bariatric Surgery  Post-Discharge VTE Risk Assessment: This patient's probability of 30-day post-discharge VTE is increased due to the factors marked:   Female    Age >/=60 years    BMI >/=50 kg/m2    CHF    Dyspnea at Rest    Paraplegia  x  Non-gastric-band surgery    Operation Time >/=3 hr    Return to OR     Length of Stay >/= 3 d   Predicted probability of 30-day post-discharge VTE: 0.16%  Other patient-specific factors to consider: n/a  Recommendation for Discharge: No pharmacologic prophylaxis post-discharge  Michelle Trevino is a 48 y.o. female who underwent gastric sleeve resection on 08/12/18   Case start: 1054 Case end: 1158   Allergies  Allergen Reactions  . Lisinopril     cough    Patient Measurements: Height: 5' 1.5" (156.2 cm) Weight: 232 lb 3.2 oz (105.3 kg) IBW/kg (Calculated) : 48.95 Body mass index is 43.16 kg/m.  Recent Labs    08/12/18 1224  HGB 11.4*  HCT 37.3   Estimated Creatinine Clearance: 82.7 mL/min (by C-G formula based on SCr of 0.97 mg/dL).    Past Medical History:  Diagnosis Date  . Anxiety   . Enlarged heart   . Fatigue   . GERD (gastroesophageal reflux disease)   . Gestational diabetes   . Heart murmur   . Hemorrhoids   . History of cocaine dependence ([place]), in remissioin 09/29/2009   no issues in 16 years  . Hypertension   . Infertility, female 12/12/2013  . Migraines   . Mild intermittent asthma   . Nerve damage of right foot    R/T lipoma  . Seasonal allergies      Medications Prior to Admission  Medication Sig Dispense Refill Last Dose  . albuterol (PROVENTIL HFA;VENTOLIN HFA) 108 (90 Base) MCG/ACT inhaler Inhale 2 puffs into the lungs every 6 (six) hours as needed for wheezing or shortness of breath.    08/11/2018 at Unknown time  . Liraglutide -Weight Management (SAXENDA) 18 MG/3ML SOPN Inject 3 mg into the skin daily. Per box  instructions. 4 pen 6 08/10/2018  . Multiple Vitamin (MULTIVITAMIN WITH MINERALS) TABS tablet Take 1 tablet by mouth daily.   08/10/2018  . spironolactone (ALDACTONE) 50 MG tablet Take 1 tablet (50 mg total) by mouth daily. 30 tablet 2 08/10/2018  . topiramate (TOPAMAX) 50 MG tablet Take 1 tablet (50 mg total) by mouth 2 (two) times daily. 60 tablet 3 08/10/2018    Lenis Noon, PharmD 08/12/2018,1:32 PM

## 2018-08-12 NOTE — Progress Notes (Signed)
Discussed post op day goals with patient including ambulation, IS, diet progression, pain, and nausea control.  Questions answered. 

## 2018-08-12 NOTE — Anesthesia Procedure Notes (Signed)
Procedure Name: Intubation Date/Time: 08/12/2018 10:28 AM Performed by: Cynda Familia, CRNA Pre-anesthesia Checklist: Patient identified, Emergency Drugs available, Suction available and Patient being monitored Patient Re-evaluated:Patient Re-evaluated prior to induction Oxygen Delivery Method: Circle System Utilized Preoxygenation: Pre-oxygenation with 100% oxygen Induction Type: IV induction, Rapid sequence and Cricoid Pressure applied Ventilation: Mask ventilation without difficulty Laryngoscope Size: Miller and 2 Grade View: Grade I Tube type: Oral Tube size: 7.0 mm Number of attempts: 1 Airway Equipment and Method: Stylet Placement Confirmation: ETT inserted through vocal cords under direct vision,  positive ETCO2 and breath sounds checked- equal and bilateral Secured at: 22 cm Tube secured with: Tape Dental Injury: Teeth and Oropharynx as per pre-operative assessment  Comments: Smooth RSI-- Sabra Heck-- intubation AM CRNA atraumatic-- teeth and mouth as preop-- poor dentition-- many missing , chipped teeth-- unchanged with laryngoscopy -- bilat BS Sabra Heck

## 2018-08-12 NOTE — Anesthesia Postprocedure Evaluation (Signed)
Anesthesia Post Note  Patient: Michelle Trevino  Procedure(s) Performed: LAPAROSCOPIC GASTRIC SLEEVE RESECTION, UPPER ENDO, Possible Hiatal Hernia Repair, ERAS Pathway (N/A )     Patient location during evaluation: PACU Anesthesia Type: General Level of consciousness: awake and alert Pain management: pain level controlled Vital Signs Assessment: post-procedure vital signs reviewed and stable Respiratory status: spontaneous breathing, nonlabored ventilation and respiratory function stable Cardiovascular status: blood pressure returned to baseline and stable Postop Assessment: no apparent nausea or vomiting Anesthetic complications: no    Last Vitals:  Vitals:   08/12/18 1245 08/12/18 1302  BP: (!) 149/81 (!) 149/83  Pulse: 100 99  Resp: 15   Temp: 36.8 C (!) 36.3 C  SpO2: 100% 100%    Last Pain:  Vitals:   08/12/18 1302  TempSrc: Oral  PainSc:                  Lynda Rainwater

## 2018-08-12 NOTE — Transfer of Care (Signed)
Immediate Anesthesia Transfer of Care Note  Patient: Michelle Trevino  Procedure(s) Performed: LAPAROSCOPIC GASTRIC SLEEVE RESECTION, UPPER ENDO, Possible Hiatal Hernia Repair, ERAS Pathway (N/A )  Patient Location: PACU  Anesthesia Type:General  Level of Consciousness: sedated  Airway & Oxygen Therapy: Patient Spontanous Breathing and Patient connected to face mask oxygen  Post-op Assessment: Report given to RN and Post -op Vital signs reviewed and stable  Post vital signs: Reviewed and stable  Last Vitals:  Vitals Value Taken Time  BP    Temp    Pulse 97 08/12/2018 12:12 PM  Resp 17 08/12/2018 12:12 PM  SpO2 100 % 08/12/2018 12:12 PM  Vitals shown include unvalidated device data.  Last Pain:  Vitals:   08/12/18 0853  TempSrc:   PainSc: 0-No pain      Patients Stated Pain Goal: 4 (57/84/69 6295)  Complications: intraop hypotension requiring Neo and IV fluid bolus-- Miller aware  B/P 137/82 in PACU

## 2018-08-13 ENCOUNTER — Encounter (HOSPITAL_COMMUNITY): Payer: Self-pay | Admitting: General Surgery

## 2018-08-13 LAB — CBC WITH DIFFERENTIAL/PLATELET
Abs Immature Granulocytes: 0.02 10*3/uL (ref 0.00–0.07)
BASOS ABS: 0 10*3/uL (ref 0.0–0.1)
Basophils Relative: 0 %
Eosinophils Absolute: 0.2 10*3/uL (ref 0.0–0.5)
Eosinophils Relative: 2 %
HCT: 35.2 % — ABNORMAL LOW (ref 36.0–46.0)
Hemoglobin: 10.9 g/dL — ABNORMAL LOW (ref 12.0–15.0)
Immature Granulocytes: 0 %
Lymphocytes Relative: 16 %
Lymphs Abs: 1.2 10*3/uL (ref 0.7–4.0)
MCH: 29.8 pg (ref 26.0–34.0)
MCHC: 31 g/dL (ref 30.0–36.0)
MCV: 96.2 fL (ref 80.0–100.0)
Monocytes Absolute: 0.6 10*3/uL (ref 0.1–1.0)
Monocytes Relative: 8 %
NRBC: 0 % (ref 0.0–0.2)
Neutro Abs: 5.6 10*3/uL (ref 1.7–7.7)
Neutrophils Relative %: 74 %
Platelets: 308 10*3/uL (ref 150–400)
RBC: 3.66 MIL/uL — ABNORMAL LOW (ref 3.87–5.11)
RDW: 12.2 % (ref 11.5–15.5)
WBC: 7.6 10*3/uL (ref 4.0–10.5)

## 2018-08-13 LAB — COMPREHENSIVE METABOLIC PANEL
ALBUMIN: 3.8 g/dL (ref 3.5–5.0)
ALT: 19 U/L (ref 0–44)
AST: 20 U/L (ref 15–41)
Alkaline Phosphatase: 42 U/L (ref 38–126)
Anion gap: 6 (ref 5–15)
BILIRUBIN TOTAL: 0.4 mg/dL (ref 0.3–1.2)
BUN: 8 mg/dL (ref 6–20)
CO2: 25 mmol/L (ref 22–32)
Calcium: 9.1 mg/dL (ref 8.9–10.3)
Chloride: 108 mmol/L (ref 98–111)
Creatinine, Ser: 0.65 mg/dL (ref 0.44–1.00)
GFR calc Af Amer: >60 mL/min (ref >60)
GFR calc non Af Amer: >60 mL/min (ref >60)
Glucose, Bld: 140 mg/dL — ABNORMAL HIGH (ref 70–99)
Potassium: 3.9 mmol/L (ref 3.5–5.1)
Sodium: 139 mmol/L (ref 135–145)
Total Protein: 6.5 g/dL (ref 6.5–8.1)

## 2018-08-13 MED ORDER — PANTOPRAZOLE SODIUM 40 MG PO TBEC: 40.0000 mg | DELAYED_RELEASE_TABLET | Freq: Every day | ORAL | 0 refills | Status: DC

## 2018-08-13 MED ORDER — GABAPENTIN 300 MG PO CAPS: 300.0000 mg | ORAL_CAPSULE | Freq: Two times a day (BID) | ORAL | 0 refills | Status: DC

## 2018-08-13 MED ORDER — ONDANSETRON 4 MG PO TBDP: 4.0000 mg | ORAL_TABLET | Freq: Four times a day (QID) | ORAL | 0 refills | Status: DC | PRN

## 2018-08-13 NOTE — Discharge Summary (Signed)
Physician Discharge Summary  Michelle Trevino WCB:[id number] DOB: [birth year] DOA: 08/12/2018  PCP: Briscoe Deutscher, DO  Admit date: 08/12/2018 Discharge date: 08/13/2018  Recommendations for Outpatient Follow-up:  1.  (include homehealth, outpatient follow-up instructions, specific recommendations for PCP to follow-up on, etc.)  Follow-up Information    Tabb Croghan, Arta Bruce, MD. Go on 09/05/2018.   Specialty:  General Surgery Why:  at 1115. Please arrive 15 minutes early for your appointment. Contact information: [address] [place] [address] [phone]        Dexter Sauser, Arta Bruce, MD .   Specialty:  General Surgery Contact information: [place] [address] [phone]          Discharge Diagnoses:  Active Problems:   Obesity   Surgical Procedure: laparoscopic sleeve gastrectomy, upper endoscopy  Discharge Condition: Good Disposition: Home  Diet recommendation: Postoperative sleeve gastrectomy diet (liquids only)  Filed Weights   08/12/18 0835  Weight: 105.3 kg     Hospital Course:  The patient was admitted after undergoing laparoscopic sleeve gastrectomy. POD 0 she ambulated well. POD 1 she was started on the water diet protocol and tolerated 600 ml in the first shift. Once meeting the water amount she was advanced to bariatric protein shakes which they tolerated and were discharged home POD 1.  Treatments: surgery: laparoscopic sleeve gastrectomy  Discharge Instructions  Discharge Instructions    Ambulate hourly while awake   Complete by:  As directed    Call MD for:  difficulty breathing, headache or visual disturbances   Complete by:  As directed    Call MD for:  persistant dizziness or light-headedness   Complete by:  As directed    Call MD for:  persistant nausea and vomiting   Complete by:  As directed    Call MD for:  redness, tenderness, or signs of infection (pain, swelling, redness, odor or  green/yellow discharge around incision site)   Complete by:  As directed    Call MD for:  severe uncontrolled pain   Complete by:  As directed    Call MD for:  temperature >101 F   Complete by:  As directed    Diet bariatric full liquid   Complete by:  As directed    Discharge wound care:   Complete by:  As directed    Remove Bandaids tomorrow, ok to shower tomorrow. Steristrips may fall off in 1-3 weeks.   Incentive spirometry   Complete by:  As directed    Perform hourly while awake     Allergies as of 08/13/2018      Reactions   Lisinopril    cough      Medication List    STOP taking these medications   Liraglutide -Weight Management 18 MG/3ML Sopn Commonly known as:  Saxenda     TAKE these medications   albuterol 108 (90 Base) MCG/ACT inhaler Commonly known as:  PROVENTIL HFA;VENTOLIN HFA Inhale 2 puffs into the lungs every 6 (six) hours as needed for wheezing or shortness of breath.   gabapentin 300 MG capsule Commonly known as:  NEURONTIN Take 1 capsule (300 mg total) by mouth 2 (two) times daily.   multivitamin with minerals Tabs tablet Take 1 tablet by mouth daily.   ondansetron 4 MG disintegrating tablet Commonly known as:  ZOFRAN-ODT Take 1 tablet (4 mg total) by mouth every 6 (six) hours as needed for nausea or vomiting.   pantoprazole 40 MG tablet Commonly known  as:  PROTONIX Take 1 tablet (40 mg total) by mouth daily.   spironolactone 50 MG tablet Commonly known as:  ALDACTONE Take 1 tablet (50 mg total) by mouth daily. Notes to patient:  Monitor Blood Pressure Daily and keep a log for primary care physician.  Monitor for symptoms of dehydration.  You may need to make changes to your medications with rapid weight loss.     topiramate 50 MG tablet Commonly known as:  Topamax Take 1 tablet (50 mg total) by mouth 2 (two) times daily.            Discharge Care Instructions  (From admission, onward)         Start     Ordered   08/13/18 0000   Discharge wound care:    Comments:  Remove Bandaids tomorrow, ok to shower tomorrow. Steristrips may fall off in 1-3 weeks.   08/13/18 0741         Follow-up Information    Rebbeca Sheperd, Arta Bruce, MD. Go on 09/05/2018.   Specialty:  General Surgery Why:  at 1115. Please arrive 15 minutes early for your appointment. Contact information: [address] [place] [address] [phone]        Antoine Vandermeulen, Arta Bruce, MD .   Specialty:  General Surgery Contact information: [place] [address] [phone]            The results of significant diagnostics from this hospitalization (including imaging, microbiology, ancillary and laboratory) are listed below for reference.    Significant Diagnostic Studies: No results found.  Labs: Basic Metabolic Panel: Recent Labs  Lab 08/07/18 1412 08/13/18 0425  NA 137 139  K 4.2 3.9  CL 105 108  CO2 24 25  GLUCOSE 128* 140*  BUN 20 8  CREATININE 0.97 0.65  CALCIUM 9.8 9.1   Liver Function Tests: Recent Labs  Lab 08/07/18 1412 08/13/18 0425  AST 16 20  ALT 19 19  ALKPHOS 55 42  BILITOT 0.2* 0.4  PROT 7.7 6.5  ALBUMIN 4.3 3.8    CBC: Recent Labs  Lab 08/07/18 1412 08/12/18 1224 08/13/18 0425  WBC 5.4  --  7.6  NEUTROABS 3.5  --  5.6  HGB 12.6 11.4* 10.9*  HCT 39.9 37.3 35.2*  MCV 95.2  --  96.2  PLT 375  --  308    CBG: Recent Labs  Lab 08/07/18 1332 08/12/18 0850  GLUCAP 101* 132*    Active Problems:   Obesity   VTE plan: no chemical prophylaxis recommended ([email or web address])  Time coordinating discharge: 65min

## 2018-08-13 NOTE — Progress Notes (Signed)
Discharge instructions given to pt and all questions were answered. Pt taken down to lobby and was picked up by family member.

## 2018-08-13 NOTE — Progress Notes (Signed)
Pt completed 12oz of water at 2145. Started protein at 2200. Pt is on her 3rd cups of protein now. No c/o nausea . Will continue to monitor.

## 2018-08-13 NOTE — Progress Notes (Signed)
Patient alert and oriented, pain is controlled. Patient is tolerating fluids, advanced to protein shake today, patient is tolerating well.  Reviewed Gastric sleeve discharge instructions with patient and patient is able to articulate understanding.  Provided information on BELT program, Support Group and [facility] outpatient pharmacy. All questions answered, will continue to monitor.  Total fluid intake 900 Per dehydration protocol call back one week post op 

## 2018-08-19 ENCOUNTER — Telehealth (HOSPITAL_COMMUNITY): Payer: Self-pay

## 2018-08-19 NOTE — Telephone Encounter (Signed)
Patient called to discuss post bariatric surgery follow up questions.  See below:   1.  Tell me about your pain and pain management?tylenol around the clock  2.  Let's talk about fluid intake.  How much total fluid are you taking in?64 ounces  3.  How much protein have you taken in the last 2 days?60 gram  4.  Have you had nausea?  Tell me about when have experienced nausea and what you did to help?nauseated Thursday but did not need Zofran  5.  Has the frequency or color changed with your urine?no problems light in color  6.  Tell me what your incisions look like?no problems  7.  Have you been passing gas? BM?stool softeners Tues, Wed, Thurs.  Took miralax Thursday night.  BM on Friday.   8.  If a problem or question were to arise who would you call?  Do you know contact numbers for [facility], [facility], and [facility]?aware of contact information  9.  How has the walking going?at work today walking regularly  10.  How are your vitamins and calcium going?  How are you taking them?taking vitamins and calcium   Asking about Linzess, instructed to call [facility] for this question.

## 2018-08-26 ENCOUNTER — Ambulatory Visit: Payer: Self-pay | Admitting: Skilled Nursing Facility1

## 2018-08-27 ENCOUNTER — Ambulatory Visit: Payer: Self-pay

## 2018-08-27 ENCOUNTER — Other Ambulatory Visit: Payer: Self-pay

## 2018-08-27 ENCOUNTER — Encounter: Payer: 59 | Attending: General Surgery | Admitting: Skilled Nursing Facility1

## 2018-08-27 DIAGNOSIS — E669 Obesity, unspecified: Secondary | ICD-10-CM | POA: Insufficient documentation

## 2018-08-28 NOTE — Progress Notes (Signed)
Bariatric Class:  Appt start time: 1530 end time:  1630.  2 Week Post-Operative Nutrition Class  Patient was seen on 08/27/2018 for Post-Operative Nutrition [facility].   Surgery date: 08/12/2018 Surgery type: sleeve Start weight at [facility]: 246.5 Weight today: 244.8  Body Composition Scale  Total Body Fat: 44.6 %  Visceral Fat: 15  Fat-Free Mass: 55.3  %   Total Body Water:  42.1 %  Muscle-Mass: 29 lbs  Body Fat Displacement:  Torso:  61.7 lbs Left Leg:12.3  lbs Right Leg:  12.3lbs Left Arm:  6.1lbs Right Arm:  6.1lbs   The following the learning objectives were met by the patient during this course:  Identifies Phase 3A (Soft, High Proteins) Dietary Goals and will begin from 2 weeks post-operatively to 2 months post-operatively  Identifies appropriate sources of fluids and proteins   States protein recommendations and appropriate sources post-operatively  Identifies the need for appropriate texture modifications, mastication, and bite sizes when consuming solids  Identifies appropriate multivitamin and calcium sources post-operatively  Describes the need for physical activity post-operatively and will follow MD recommendations  States when to call healthcare provider regarding medication questions or post-operative complications  Handouts given during class include:  Phase 3A: Soft, High Protein Diet Handout  Follow-Up Plan: Patient will follow-up at [facility] in 6 weeks for 2 month post-op nutrition visit for diet advancement per MD.

## 2018-09-03 ENCOUNTER — Telehealth: Payer: Self-pay | Admitting: Skilled Nursing Facility1

## 2018-09-03 NOTE — Telephone Encounter (Signed)
RD called pt to verify fluid intake once starting soft, solid proteins 2 week post-bariatric surgery.   Daily Fluid intake: Daily Protein intake:  Concerns/issues:   LVM 

## 2018-09-04 ENCOUNTER — Telehealth: Payer: Self-pay | Admitting: Skilled Nursing Facility1

## 2018-09-04 NOTE — Telephone Encounter (Signed)
RD called pt to verify fluid intake once starting soft, solid proteins 2 week post-bariatric surgery.   Daily Fluid intake: 64 Daily Protein intake: 60 g  Concerns/issues:   Pt states she puts herself down for not getting enough in.

## 2018-09-24 ENCOUNTER — Telehealth: Payer: Self-pay | Admitting: Family Medicine

## 2018-09-24 NOTE — Telephone Encounter (Signed)
error 

## 2018-10-09 ENCOUNTER — Other Ambulatory Visit: Payer: Self-pay

## 2018-10-09 ENCOUNTER — Encounter: Payer: 59 | Attending: General Surgery | Admitting: Skilled Nursing Facility1

## 2018-10-09 DIAGNOSIS — E669 Obesity, unspecified: Secondary | ICD-10-CM | POA: Diagnosis not present

## 2018-10-09 NOTE — Progress Notes (Signed)
Bariatric Follow-Up Visit 2 Months Medical Nutrition Therapy   Primary Concerns Today: Bariatric Surgery Nutrition Follow Up   Bariatric Surgery Type: sleeve    Surgery Date: 08/12/2018   NUTRITION ASSESSMENT   Anthropometrics  Start weight at [facility]: 246.5  lbs  Today's weight: 244.8  lbs Weight change:   lbs (since previous nutrition appointment)   Body Composition Results: printer not printing  Fat%: 43.2 Water%: 42.8   Psychosocial/Lifestyle  Pt states chewable vitamins make her nauseous and make her want to vomit.  Pt states she has milk with her calcium chocolate chew for her sweet cravings. Pt was advised to do miralax and stool softener for er bowel movements. Pt states she feels she is too full to drink enough water.  Pt states she is [place] and loves starch but has not been craving it. Pt states her wife is very supportive.      Medications:  Labs:  Supplements:  Vitamin Patches and viactive   24-Hr Dietary Recall First Meal 8-9am: protein shake (conveinant)-1 hour to drink Snack:  Second Meal: tuna:2.5 ounces with cheese stick Snack: sometimes yogurt Third Meal: soy crumbles with chili beans and cheese Snack: 2 sugar free popcicles Beverages: powerade zero, water, coffee sugar free syrup and stok (5g protein) with collegan, 8 ounces of skim milk   Estimated Daily Fluid Intake:  41 oz Estimated Daily Protein Intake: 60+ g   Physical Activity  Current average weekly physical activity: treadmill 2 times a week (wants to increase this) 30 minutes    Signs/Symptoms  Using straws: no Drinking while eating: no Chewing/swallowing difficulties: no Changes in vision: no Changes to mood/headaches: no Hair loss/changes to skin/nails: no Difficulty focusing/concentrating: no Sweating: no Dizziness/lightheadedness: no Palpitations: no Carbonated/caffeinated beverages: no N/V/D/C/Gas: 2 bowel movements a week Abdominal pain: no Dumping syndrome: no     NUTRITION  DIAGNOSIS  Overweight/obesity ([place]-3.3) related to past poor dietary habits and physical inactivity as evidenced by patient w/ completed Bariatric surgery following dietary guidelines for continued weight loss and healthy nutrition status.     NUTRITION INTERVENTION Nutrition counseling (C-1) and education (E-2) to facilitate bariatric surgery goals, including:  Diet advancement to the next phase now including non-starchy vegetables  The importance of consuming adequate calories as well as certain nutrients daily due to the body's need for essential vitamins, minerals, and fats  The importance of daily physical activity and to reach a goal of at least 150 minutes of moderate to vigorous physical activity weekly (or as directed by their physician) due to benefits such as increased musculature and improved lab values  Pt Chosen Goals: -Continue to aim for a minimum of 64 fluid ounces 7 days a week with at least 30 ounces being plain water -Eat non-starchy vegetables 2 times a day 7 days a week -Start out with soft cooked vegetables today and tomorrow; if tolerated begin to eat raw vegetables or cooked including salads -Eat your 3 ounces of protein first then start in on your non-starchy vegetables; once you understand how much of your meal leads to satisfaction and not full while still eating 3 ounces of protein and non-starchy vegetables you can eat them in any order  -Continue to aim for 30 minutes of activity at least 5 times a week -Do NOT cook with/add to your food: alfredo sauce, cheese sauce, barbeque sauce, ketchup, fat back, butter, bacon grease, grease, Crisco  -Start taking the capsule multivitamin: have food on your stomach first  -Try Alkaline water  ph 8. -Try kefir  Handouts Provided Include  Non starchy veggies + protein   Readiness for Change: Ready   Demonstrated degree of understanding via: Teach Back       MONITORING & EVALUATION Dietary intake, weekly physical  activity, and body weight follow up in 2 months

## 2018-10-09 NOTE — Patient Instructions (Addendum)
-  Continue to aim for a minimum of 64 fluid ounces 7 days a week with at least 30 ounces being plain water  -Eat non-starchy vegetables 2 times a day 7 days a week  -Start out with soft cooked vegetables today and tomorrow; if tolerated begin to eat raw vegetables or cooked including salads  -Eat your 3 ounces of protein first then start in on your non-starchy vegetables; once you understand how much of your meal leads to satisfaction and not full while still eating 3 ounces of protein and non-starchy vegetables you can eat them in any order   -Continue to aim for 30 minutes of activity at least 5 times a week  -Do NOT cook with/add to your food: alfredo sauce, cheese sauce, barbeque sauce, ketchup, fat back, butter, bacon grease, grease, Crisco    -Start taking the capsule multivitamin: have food on your stomach first   -Alkaline water ph 8.8  -Try kefir

## 2018-11-29 ENCOUNTER — Encounter: Payer: Self-pay | Admitting: Family Medicine

## 2018-12-10 ENCOUNTER — Other Ambulatory Visit: Payer: Self-pay

## 2018-12-10 ENCOUNTER — Encounter: Payer: 59 | Attending: General Surgery | Admitting: Skilled Nursing Facility1

## 2018-12-10 DIAGNOSIS — E669 Obesity, unspecified: Secondary | ICD-10-CM | POA: Insufficient documentation

## 2018-12-10 NOTE — Progress Notes (Signed)
Bariatric Follow-Up Visit 2 Months Medical Nutrition Therapy   Primary Concerns Today: Bariatric Surgery Nutrition Follow Up   Bariatric Surgery Type: sleeve    Surgery Date: 08/12/2018   NUTRITION ASSESSMENT   Anthropometrics  Surgery date: 08/12/2018 Start weight at [facility]: 246.5  lbs  Today's weight: 194.6 lbs    Body Composition Scale 12/10/2018  Total Body Fat % 41.1  Visceral Fat 13  Fat-Free Mass % 58.8   Total Body Water % 43.9   Muscle-Mass lbs 28.7  Body Fat Displacement          Torso  lbs 49.6         Left Leg  lbs 9.9         Right Leg  lbs 9.9         Left Arm  lbs 4.9         Right Arm   lbs 4.9   Psychosocial/Lifestyle Pt states she is [place] and loves starch but has not been craving it. Pt states her wife is very supportive.  Pt states the alkaline water has helped but still struggling with amount but has been working on increasing this. Pt states she has noticed she needs naps which is unusual for her. Pt states she lost her wifes grandmother recently so she has felt emotionally drained. Pt states she now has a bowel movement about every other day. Pt states she needs to walk after every time she eats sating it just sits there admitting she is pretty sure she is not chewing well enough.  Pt states he weighs herself 1 time a week. Pt states she is no longer going to look at the scale.      Supplements:  Ultra solo at night and viactive   24-Hr Dietary Recall First Meal 8-9am: protein shake (conveinant)-1 hour to drink or half a protein shake Snack: greek yogurt Second Meal: 6 in sub just the insides  Snack: sometimes yogurt Third Meal: pinto beans with ham and greens Snack: 2 sugar free popcicles Beverages: powerade zero, water, coffee sugar free syrup and stok (5g protein) with collegan, 8 ounces of skim milk    Estimated Daily Fluid Intake:  40 oz Estimated Daily Protein Intake: 60+ g   Physical Activity  Current average weekly physical activity: 5 days  a week for 25 minutes    Signs/Symptoms  Using straws: no Drinking while eating: no Chewing/swallowing difficulties: no Changes in vision: no Changes to mood/headaches: no Hair loss/changes to skin/nails: no Difficulty focusing/concentrating: no Sweating: no Dizziness/lightheadedness: no Palpitations: no Carbonated/caffeinated beverages: no N/V/D/C/Gas: some constipation  Abdominal pain: no Dumping syndrome: no     NUTRITION DIAGNOSIS  Overweight/obesity ([place]-3.3) related to past poor dietary habits and physical inactivity as evidenced by patient w/ completed Bariatric surgery following dietary guidelines for continued weight loss and healthy nutrition status.     NUTRITION INTERVENTION Nutrition counseling (C-1) and education (E-2) to facilitate bariatric surgery goals, including:  The importance of consuming adequate calories as well as certain nutrients daily due to the body's need for essential vitamins, minerals, and fats  The importance of daily physical activity and to reach a goal of at least 150 minutes of moderate to vigorous physical activity weekly (or as directed by their physician) due to benefits such as increased musculature and improved lab values  Pt Chosen Goals: -Aim for non starchy veggies 2 times a day 7 days a week -Continue to aim for 64 fluid ounces 7 days a week -  Continue to work on chewing until applesauce consistency  -Continue to do your wall push ups and other weight bearing exercises -Add in starchy veggies: 1/2 cup or less per serving; not fried     Readiness for Change: Ready   Demonstrated degree of understanding via: Teach Back       MONITORING & EVALUATION Dietary intake, weekly physical activity, and body weight follow up in 2 months

## 2018-12-10 NOTE — Patient Instructions (Addendum)
-  Aim for non starchy veggies 2 times a day 7 days a week  -Continue to aim for 64 fluid ounces 7 days a week  -Continue to work on chewing until applesauce consistency   -Continue to do your wall push ups and other weight bearing exercises   -Add in starchy veggies: 1/2 cup or less per serving; not fried

## 2019-02-17 ENCOUNTER — Encounter: Payer: 59 | Attending: General Surgery | Admitting: Skilled Nursing Facility1

## 2019-02-17 ENCOUNTER — Other Ambulatory Visit: Payer: Self-pay

## 2019-02-17 DIAGNOSIS — E669 Obesity, unspecified: Secondary | ICD-10-CM | POA: Insufficient documentation

## 2019-02-17 NOTE — Progress Notes (Signed)
Bariatric Follow-Up Visit  Medical Nutrition Therapy   Primary Concerns Today: Bariatric Surgery Nutrition Follow Up   Bariatric Surgery Type: sleeve    Surgery Date: 08/12/2018   NUTRITION ASSESSMENT   Anthropometrics  Surgery date: 08/12/2018 Start weight at [facility]: 246.5  lbs  Today's weight: 181.5 Weight change: 13  Body Composition Scale 12/10/2018 02/17/2019  Total Body Fat % 41.1 39.1  Visceral Fat 13 11  Fat-Free Mass % 58.8 60.8   Total Body Water % 43.9 44.9   Muscle-Mass lbs 28.7 28.5  Body Fat Displacement           Torso  lbs 49.6 43.9         Left Leg  lbs 9.9 8.7         Right Leg  lbs 9.9 8.7         Left Arm  lbs 4.9 4.3         Right Arm   lbs 4.9 4.3   Psychosocial/Lifestyle Pt states she is [place] and loves starch but has not been craving it. Pt states her wife is very supportive.   Pt state she switched to a 10 ounce cup it is easier to get in her fluid.  Pt state she wants to lose more weight.  Pt states corn feels a little weird so she avoids.  Pt states she is getting better with chewing.  Pt states she been trying sea moss: dietitian cautioned pt.   Supplements:  Ultra solo at night and viactive   24-Hr Dietary Recall First Meal 8-9am: bacon and 1-2 eggs Snack: greek yogurt or peanuts  Second Meal: 6 in sub just the insides  Snack: sometimes yogurt Third Meal: pinto beans with ham and greens or grilled scallops with butternut sqush risotto Snack: 2 sugar free popcicles Beverages: powerade zero, water, coffee sugar free syrup and stok (5g protein) with collegan, 8 ounces of skim milk    Estimated Daily Fluid Intake:  55+ oz (up from 40) Estimated Daily Protein Intake: 60+ g   Physical Activity  Current average weekly physical activity: 5 days a week for 25 minutes    Signs/Symptoms  Using straws: no Drinking while eating: no Chewing/swallowing difficulties: no Changes in vision: no Changes to mood/headaches: no Hair loss/changes to  skin/nails: no Difficulty focusing/concentrating: no Sweating: no Dizziness/lightheadedness: no Palpitations: no Carbonated/caffeinated beverages: no N/V/D/C/Gas: no Abdominal pain: no Dumping syndrome: no     NUTRITION DIAGNOSIS  Overweight/obesity ([place]-3.3) related to past poor dietary habits and physical inactivity as evidenced by patient w/ completed Bariatric surgery following dietary guidelines for continued weight loss and healthy nutrition status.     NUTRITION INTERVENTION Nutrition counseling (C-1) and education (E-2) to facilitate bariatric surgery goals, including:  The importance of consuming adequate calories as well as certain nutrients daily due to the body's need for essential vitamins, minerals, and fats  The importance of daily physical activity and to reach a goal of at least 150 minutes of moderate to vigorous physical activity weekly (or as directed by their physician) due to benefits such as increased musculature and improved lab values  Pt Chosen Goals: -Any fruit 1/3-1/2 cup  -Try kodiak oatmeal  Readiness for Change: Ready   Demonstrated degree of understanding via: Teach Back       MONITORING & EVALUATION Dietary intake, weekly physical activity, and body weight follow up in 2 months

## 2019-02-19 ENCOUNTER — Ambulatory Visit: Payer: 59 | Admitting: Skilled Nursing Facility1

## 2019-03-09 NOTE — Progress Notes (Signed)
Michelle Trevino is a 48 y.o. female is here for follow up.  History of Present Illness:   Brendell Tyus, RMA, acting as scribe for Dr. Briscoe Deutscher.   HPI: Patient here for a 6 month follow up on Laparoscopic Gastric Sleeve Resection.  Patient has no complaints today. On no medications regularly. Uses Protonix rarely but wants to keep it on her medication list. Taking Bariatric MVM and calcium. Walking for exercise. Still concerned about facial hair.  Health Maintenance Due  Topic Date Due  . HIV Screening  11/06/1987   Depression screen [facility] 2/9 03/10/2019 04/02/2018 01/08/2015  Decreased Interest 0 0 0  Down, Depressed, Hopeless 0 0 0  PHQ - 2 Score 0 0 0  Altered sleeping 0 - -  Tired, decreased energy 0 - -  Change in appetite 0 - -  Feeling bad or failure about yourself  0 - -  Trouble concentrating 0 - -  Moving slowly or fidgety/restless 0 - -  Suicidal thoughts 0 - -  PHQ-9 Score 0 - -  Difficult doing work/chores Not difficult at all - -   PMHx, SurgHx, SocialHx, FamHx, Medications, and Allergies were reviewed in the Visit Navigator and updated as appropriate.   Patient Active Problem List   Diagnosis Date Noted  . S/P bariatric surgery 03/10/2019  . Obesity 08/12/2018  . Mild intermittent asthma without complication [id number]  . Nicotine dependence 04/03/2018  . OSA on CPAP 07/01/2013  . Hyperlipidemia with target LDL less than 100 04/17/2013  . Arthritis   . LVH (left ventricular hypertrophy) 07/28/2010  . PTSD 09/29/2009   Social History   Tobacco Use  . Smoking status: Former Smoker    Packs/day: 0.50    Years: 10.00    Pack years: 5.00    Types: Cigarettes    Quit date: 06/11/2018    Years since quitting: 0.7  . Smokeless tobacco: Never Used  Substance Use Topics  . Alcohol use: Yes    Alcohol/week: 1.0 standard drinks    Types: 1 Glasses of wine per week    Comment: every 1 wks  . Drug use: No    Comment: HISTORY OF COCAINE DEPENDENCE    Current Medications and Allergies   .  albuterol (PROVENTIL HFA;VENTOLIN HFA) 108 (90 Base) MCG/ACT inhaler, Inhale 2 puffs into the lungs every 6 (six) hours as needed for wheezing or shortness of breath. , Disp: , Rfl:  .  Multiple Vitamin (MULTIVITAMIN WITH MINERALS) TABS tablet, Take 1 tablet by mouth daily., Disp: , Rfl:  .  pantoprazole (PROTONIX) 40 MG tablet, Take 1 tablet (40 mg total) by mouth daily., Disp: 90 tablet, Rfl: 1   Allergies  Allergen Reactions  . Lisinopril     cough   Review of Systems   Pertinent items are noted in the HPI. Otherwise, a complete ROS is negative.  Vitals   Vitals:   03/10/19 1023  Pulse: 72  Temp: 98.2 F (36.8 C)  TempSrc: Temporal  SpO2: 100%  Weight: 180 lb 6.4 oz (81.8 kg)  Height: 5' 1.5" (1.562 m)     Body mass index is 33.53 kg/m.  Physical Exam   Physical Exam Vitals signs and nursing note reviewed.  HENT:     Head: Normocephalic and atraumatic.  Eyes:     Pupils: Pupils are equal, round, and reactive to light.  Neck:     Musculoskeletal: Normal range of motion and neck supple.  Cardiovascular:     Rate  and Rhythm: Normal rate and regular rhythm.     Heart sounds: Normal heart sounds.  Pulmonary:     Effort: Pulmonary effort is normal.  Abdominal:     Palpations: Abdomen is soft.  Skin:    General: Skin is warm.  Psychiatric:        Behavior: Behavior normal.     Assessment and Plan   Michelle Trevino was seen today for follow-up.  Diagnoses and all orders for this visit:  Fatigue, unspecified type -     CBC with Differential/Platelet -     Comprehensive metabolic panel -     Magnesium -     Vitamin B12 -     Iron, TIBC and Ferritin Panel -     TSH  Need for immunization against influenza -     Flu Vaccine QUAD 36+ mos IM  OSA on CPAP Comments: Followed by [facility]. Encouraged new sleep study.  Cigarette nicotine dependence without complication Comments: Back to smoking 1/2 PPD. Reviewed the  risks.  S/P bariatric surgery Comments: Labs today. Down 80 pounds. Feels good. Orders: -     CBC with Differential/Platelet -     Comprehensive metabolic panel -     pantoprazole (PROTONIX) 40 MG tablet; Take 1 tablet (40 mg total) by mouth daily.  Vitamin D deficiency -     VITAMIN D 25 Hydroxy (Vit-D Deficiency, Fractures)  History of insulin resistance -     Hemoglobin A1c  Female hirsutism -     Eflornithine HCl 13.9 % cream; Apply BID  Pure hypercholesterolemia -     Lipid panel    . Orders and follow up as documented in [place], reviewed diet, exercise and weight control, cardiovascular risk and specific lipid/LDL goals reviewed, reviewed medications and side effects in detail.  . Reviewed expectations re: course of current medical issues. . Outlined signs and symptoms indicating need for more acute intervention. . Patient verbalized understanding and all questions were answered. . Patient received an After Visit Summary.  CMA served as Education administrator during this visit. History, Physical, and Plan performed by medical provider. The above documentation has been reviewed and is accurate and complete. Briscoe Deutscher, D.O.  Briscoe Deutscher, DO [place], [place] 03/10/2019

## 2019-03-10 ENCOUNTER — Ambulatory Visit: Payer: 59 | Admitting: Family Medicine

## 2019-03-10 ENCOUNTER — Encounter: Payer: Self-pay | Admitting: Family Medicine

## 2019-03-10 ENCOUNTER — Other Ambulatory Visit: Payer: Self-pay

## 2019-03-10 VITALS — HR 72 | Temp 98.2°F | Ht 61.5 in | Wt 180.4 lb

## 2019-03-10 DIAGNOSIS — E559 Vitamin D deficiency, unspecified: Secondary | ICD-10-CM | POA: Diagnosis not present

## 2019-03-10 DIAGNOSIS — Z9884 Bariatric surgery status: Secondary | ICD-10-CM

## 2019-03-10 DIAGNOSIS — Z23 Encounter for immunization: Secondary | ICD-10-CM | POA: Diagnosis not present

## 2019-03-10 DIAGNOSIS — G4733 Obstructive sleep apnea (adult) (pediatric): Secondary | ICD-10-CM | POA: Diagnosis not present

## 2019-03-10 DIAGNOSIS — E7800 Pure hypercholesterolemia, unspecified: Secondary | ICD-10-CM | POA: Diagnosis not present

## 2019-03-10 DIAGNOSIS — F17210 Nicotine dependence, cigarettes, uncomplicated: Secondary | ICD-10-CM

## 2019-03-10 DIAGNOSIS — L680 Hirsutism: Secondary | ICD-10-CM

## 2019-03-10 DIAGNOSIS — Z8639 Personal history of other endocrine, nutritional and metabolic disease: Secondary | ICD-10-CM | POA: Diagnosis not present

## 2019-03-10 DIAGNOSIS — R5383 Other fatigue: Secondary | ICD-10-CM

## 2019-03-10 DIAGNOSIS — Z9989 Dependence on other enabling machines and devices: Secondary | ICD-10-CM

## 2019-03-10 LAB — COMPREHENSIVE METABOLIC PANEL
ALT: 11 U/L (ref 0–35)
AST: 13 U/L (ref 0–37)
Albumin: 4.3 g/dL (ref 3.5–5.2)
Alkaline Phosphatase: 64 U/L (ref 39–117)
BUN: 10 mg/dL (ref 6–23)
CO2: 30 mEq/L (ref 19–32)
Calcium: 10.2 mg/dL (ref 8.4–10.5)
Chloride: 106 mEq/L (ref 96–112)
Creatinine, Ser: 0.69 mg/dL (ref 0.40–1.20)
GFR: 91.46 mL/min (ref >60.00)
Glucose, Bld: 97 mg/dL (ref 70–99)
Potassium: 4.8 mEq/L (ref 3.5–5.1)
Sodium: 141 mEq/L (ref 135–145)
Total Bilirubin: 0.4 mg/dL (ref 0.2–1.2)
Total Protein: 6.9 g/dL (ref 6.0–8.3)

## 2019-03-10 LAB — CBC WITH DIFFERENTIAL/PLATELET
Basophils Absolute: 0.1 10*3/uL (ref 0.0–0.1)
Basophils Relative: 1.2 % (ref 0.0–3.0)
Eosinophils Absolute: 0.1 10*3/uL (ref 0.0–0.7)
Eosinophils Relative: 1.8 % (ref 0.0–5.0)
HCT: 38.5 % (ref 36.0–46.0)
Hemoglobin: 12.8 g/dL (ref 12.0–15.0)
Lymphocytes Relative: 39.1 % (ref 12.0–46.0)
Lymphs Abs: 1.7 10*3/uL (ref 0.7–4.0)
MCHC: 33.2 g/dL (ref 30.0–36.0)
MCV: 94.3 fl (ref 78.0–100.0)
Monocytes Absolute: 0.3 10*3/uL (ref 0.1–1.0)
Monocytes Relative: 7.1 % (ref 3.0–12.0)
Neutro Abs: 2.2 10*3/uL (ref 1.4–7.7)
Neutrophils Relative %: 50.8 % (ref 43.0–77.0)
Platelets: 326 10*3/uL (ref 150.0–400.0)
RBC: 4.08 Mil/uL (ref 3.87–5.11)
RDW: 12.8 % (ref 11.5–15.5)
WBC: 4.4 10*3/uL (ref 4.0–10.5)

## 2019-03-10 LAB — MAGNESIUM: Magnesium: 2.1 mg/dL (ref 1.5–2.5)

## 2019-03-10 LAB — LIPID PANEL
Cholesterol: 167 mg/dL (ref 0–200)
HDL: 55 mg/dL (ref >39.00)
LDL Cholesterol: 102 mg/dL — ABNORMAL HIGH (ref 0–99)
NonHDL: 112.05
Total CHOL/HDL Ratio: 3
Triglycerides: 51 mg/dL (ref 0.0–149.0)
VLDL: 10.2 mg/dL (ref 0.0–40.0)

## 2019-03-10 LAB — VITAMIN D 25 HYDROXY (VIT D DEFICIENCY, FRACTURES): VITD: 54.91 ng/mL (ref 30.00–100.00)

## 2019-03-10 LAB — TSH: TSH: 0.79 u[IU]/mL (ref 0.35–4.50)

## 2019-03-10 LAB — HEMOGLOBIN A1C: Hgb A1c MFr Bld: 4.8 % (ref 4.6–6.5)

## 2019-03-10 LAB — VITAMIN B12: Vitamin B-12: 474 pg/mL (ref 211–911)

## 2019-03-10 MED ORDER — EFLORNITHINE HCL 13.9 % EX CREA: TOPICAL_CREAM | CUTANEOUS | 3 refills | Status: DC

## 2019-03-10 MED ORDER — PANTOPRAZOLE SODIUM 40 MG PO TBEC: 40.0000 mg | DELAYED_RELEASE_TABLET | Freq: Every day | ORAL | 1 refills | Status: AC

## 2019-03-11 LAB — IRON,TIBC AND FERRITIN PANEL
%SAT: 25 % (calc) (ref 16–45)
Ferritin: 36 ng/mL (ref 16–232)
Iron: 82 ug/dL (ref 40–190)
TIBC: 324 mcg/dL (calc) (ref 250–450)

## 2019-03-12 ENCOUNTER — Encounter: Payer: Self-pay | Admitting: Family Medicine

## 2019-03-24 NOTE — Telephone Encounter (Signed)
Casee tate-wall Key: [id number] - PA Case ID: [id number] Need help? Call us at [phone] Status Sent to [place] 13.9% cream Form [employer] Electronic Prior Authorization Form (2017 NCPDP)

## 2019-04-21 ENCOUNTER — Ambulatory Visit (INDEPENDENT_AMBULATORY_CARE_PROVIDER_SITE_OTHER): Payer: 59 | Admitting: Family Medicine

## 2019-04-21 ENCOUNTER — Other Ambulatory Visit (HOSPITAL_COMMUNITY)
Admission: RE | Admit: 2019-04-21 | Discharge: 2019-04-21 | Disposition: A | Discharge status: Home / Self Care | Payer: 59 | Source: Ambulatory Visit | Attending: Family Medicine | Admitting: Family Medicine

## 2019-04-21 ENCOUNTER — Other Ambulatory Visit: Payer: Self-pay

## 2019-04-21 ENCOUNTER — Encounter: Payer: Self-pay | Admitting: Family Medicine

## 2019-04-21 VITALS — BP 132/78 | HR 70 | Temp 98.3°F | Ht 61.5 in | Wt 183.0 lb

## 2019-04-21 DIAGNOSIS — Z9884 Bariatric surgery status: Secondary | ICD-10-CM

## 2019-04-21 DIAGNOSIS — Z0000 Encounter for general adult medical examination without abnormal findings: Secondary | ICD-10-CM | POA: Diagnosis present

## 2019-04-21 DIAGNOSIS — J4520 Mild intermittent asthma, uncomplicated: Secondary | ICD-10-CM | POA: Diagnosis not present

## 2019-04-21 DIAGNOSIS — Z9989 Dependence on other enabling machines and devices: Secondary | ICD-10-CM

## 2019-04-21 DIAGNOSIS — N6012 Diffuse cystic mastopathy of left breast: Secondary | ICD-10-CM

## 2019-04-21 DIAGNOSIS — Z124 Encounter for screening for malignant neoplasm of cervix: Secondary | ICD-10-CM | POA: Diagnosis present

## 2019-04-21 DIAGNOSIS — N898 Other specified noninflammatory disorders of vagina: Secondary | ICD-10-CM

## 2019-04-21 DIAGNOSIS — N6011 Diffuse cystic mastopathy of right breast: Secondary | ICD-10-CM | POA: Insufficient documentation

## 2019-04-21 DIAGNOSIS — G4733 Obstructive sleep apnea (adult) (pediatric): Secondary | ICD-10-CM

## 2019-04-21 DIAGNOSIS — Z1231 Encounter for screening mammogram for malignant neoplasm of breast: Secondary | ICD-10-CM

## 2019-04-21 MED ORDER — ALBUTEROL SULFATE HFA 108 (90 BASE) MCG/ACT IN AERS: 2.0000 | INHALATION_SPRAY | Freq: Four times a day (QID) | RESPIRATORY_TRACT | 3 refills | Status: AC | PRN

## 2019-04-21 NOTE — Progress Notes (Signed)
Subjective  Chief Complaint  Patient presents with  . Annual Exam    HPI: Michelle Trevino is a 48 y.o. female who presents to [facility] [place] [facility] [place] today for a Female Wellness Visit. She also has the concerns and/or needs as listed above in the chief complaint. These will be addressed in addition to the Health Maintenance Visit.   Wellness Visit: annual visit with health maintenance review and exam with Pap   HM: had recent lab work: reviewed and all stable. Healthy female down 80 pounds since weight loss surgery. Happily married to [employer]; works full time managing a [employer]. No major concerns. Due pap and mamm Chronic disease f/u and/or acute problem visit: (deemed necessary to be done in addition to the wellness visit):  Vaginal discharge with some soreness x several days just self treated with monistat 2 days ago and improving. W/o sores or pelvic pain. + mild itching.   MIA: rare need for albuterol but needs refill. Triggers are exercise and stress   Assessment  1. Annual physical exam   2. H/O gastric bypass   3. Mild intermittent asthma without complication   4. Cervical cancer screening   5. Vaginal discharge   6. Encounter for screening mammogram for breast cancer   7. Fibrocystic breast changes, bilateral   8. OSA on CPAP      Plan  Female Wellness Visit:  Age appropriate Health Maintenance and Prevention measures were discussed with patient. Included topics are cancer screening recommendations, ways to keep healthy (see AVS) including dietary and exercise recommendations, regular eye and dental care, use of seat belts, and avoidance of moderate alcohol use and tobacco use. Pap with HR HPV today. mammo ordered  BMI: discussed patient's BMI and encouraged positive lifestyle modifications to help get to or maintain a target BMI.  HM needs and immunizations were addressed and ordered. See below for orders. See HM and immunization  section for updates. utd  Routine labs and screening tests ordered including cmp, cbc and lipids where appropriate.  Discussed recommendations regarding Vit D and calcium supplementation (see AVS)  Chronic disease management visit and/or acute problem visit:  Suspect yeast: treated and should improve. Await testing.  Refilled albuterol  On cpap for osa w/o problems. May resolve with further weight loss  Prn PPI since surgery.   Follow up: Return in about 1 year (around 04/20/2020) for complete physical.  Orders Placed This Encounter  Procedures  . mammo BC   Meds ordered this encounter  Medications  . albuterol (VENTOLIN HFA) 108 (90 Base) MCG/ACT inhaler    Sig: Inhale 2 puffs into the lungs every 6 (six) hours as needed for wheezing or shortness of breath.    Dispense:  18 g    Refill:  3      Lifestyle: Body mass index is 34.02 kg/m. Wt Readings from Last 3 Encounters:  04/21/19 183 lb (83 kg)  03/10/19 180 lb 6.4 oz (81.8 kg)  02/17/19 181 lb 8 oz (82.3 kg)     Patient Active Problem List   Diagnosis Date Noted  . H/O gastric bypass - gastric sleeve 2020 03/10/2019    Priority: High  . OSA on CPAP 07/01/2013    Priority: High  . PTSD 09/29/2009    Priority: [employer].   . Obesity 08/12/2018    Priority: Medium  . Mild intermittent asthma without complication [id number]    Priority: Medium    Partially  disabled, [place].    Marland Kitchen Fibrocystic breast changes, bilateral 04/21/2019    Priority: Low  . LVH (left ventricular hypertrophy) 07/28/2010    Priority: Low    Hx of work-up, including stress test and Cardiac CT, normal. Will work to control HTN.  No further workup at this time.  07/2013 CT:  1. Coronary calcium score of 0. This was 0 percentile for age and sex matched control. 2. Normal coronary artery origin. 3.  Left dominance, rudimentary RCA. 4. No coronary artery disease.    Health Maintenance  Topic Date Due  . HIV Screening  11/06/1987   . PAP SMEAR-Modifier  04/24/2019 (Originally 08/16/2013)  . TETANUS/TDAP  07/01/2028  . INFLUENZA VACCINE  Completed   Immunization History  Administered Date(s) Administered  . Influenza Split 03/10/2011, 04/18/2011, 03/07/2012  . Influenza Whole 07/03/2008, 04/15/2010  . Influenza,inj,Quad PF,6+ Mos 07/01/2013, 02/16/2018, 03/10/2019  . PPD Test 11/03/2011  . Td 01/04/2008  . Tdap 07/01/2018   We updated and reviewed the patient's past history in detail and it is documented below. Allergies: Patient is allergic to lisinopril. Past Medical History Patient  has a past medical history of Anxiety, Enlarged heart, GERD (gastroesophageal reflux disease), Gestational diabetes, Heart murmur, Hemorrhoids, History of cocaine dependence ([place]), in remissioin (09/29/2009), Hypertension, Migraines, Mild intermittent asthma, Nerve damage of right foot, Seasonal allergies, and Sleep apnea. Past Surgical History Patient  has a past surgical history that includes Tubal ligation (2000); Lipoma resection (Left, 11/2012); Fluoroscopic tubal recannulatuon (2011); Foot surgery (Bilateral); Mouth surgery; Liposuction; and Laparoscopic gastric sleeve resection (N/A, 08/12/2018). Family History: Patient family history includes Cancer in her maternal grandmother; Diabetes in her mother; Hepatitis in her mother; Hypertension in her mother; Osteoarthritis in her mother; Stomach cancer (age of onset: 18) in her father; Stroke (age of onset: 26) in her brother; Stroke (age of onset: 65) in her mother. Social History:  Patient  reports that she quit smoking about 10 months ago. Her smoking use included cigarettes. She has a 5.00 pack-year smoking history. She has never used smokeless tobacco. She reports current alcohol use of about 1.0 standard drinks of alcohol per week. She reports that she does not use drugs.  Review of Systems: Constitutional: negative for fever or malaise Ophthalmic: negative for photophobia,  double vision or loss of vision Cardiovascular: negative for chest pain, dyspnea on exertion, or new LE swelling Respiratory: negative for SOB or persistent cough Gastrointestinal: negative for abdominal pain, change in bowel habits or melena Genitourinary: negative for dysuria or gross hematuria, no abnormal uterine bleeding or disharge Musculoskeletal: negative for new gait disturbance or muscular weakness Integumentary: negative for new or persistent rashes, no breast lumps Neurological: negative for TIA or stroke symptoms Psychiatric: negative for SI or delusions Allergic/Immunologic: negative for hives  Patient Care Team    Relationship Specialty Notifications Start End  Leamon Arnt, MD PCP - General Family Medicine  04/21/19   Surgery, [place] Physician General Surgery  04/26/18     Objective  Vitals: BP 132/78 (BP Location: Left Arm, Patient Position: Sitting, Cuff Size: Normal)   Pulse 70   Temp 98.3 F (36.8 C) (Temporal)   Ht 5' 1.5" (1.562 m)   Wt 183 lb (83 kg)   SpO2 99%   BMI 34.02 kg/m  General:  Well developed, well nourished, no acute distress  Psych:  Alert and orientedx3,normal mood and affect HEENT:  Normocephalic, atraumatic, non-icteric sclera, PERRL, oropharynx is clear without mass or exudate, supple  neck without adenopathy, mass or thyromegaly Cardiovascular:  Normal S1, S2, RRR without gallop, rub or murmur, nondisplaced PMI Respiratory:  Good breath sounds bilaterally, CTAB with normal respiratory effort Gastrointestinal: normal bowel sounds, soft, non-tender, no noted masses. No HSM MSK: no deformities, contusions. Joints are without erythema or swelling. Spine and CVA region are nontender Skin:  Warm, no rashes or suspicious lesions noted Neurologic:    Mental status is normal. CN 2-11 are normal. Gross motor and sensory exams are normal. Normal gait. No tremor Breast Exam: No mass, skin retraction or nipple discharge is  appreciated in either breast. No axillary adenopathy. Fibrocystic changes are not noted Pelvic Exam: Normal external genitalia, no vulvar or vaginal lesions present. Clear cervix w/o CMT. White vaginal discharge/meds present with mild introitus erythema. Bimanual exam reveals a nontender fundus w/o masses, nl size. No adnexal masses present. No inguinal adenopathy. A PAP smear was performed.    Commons side effects, risks, benefits, and alternatives for medications and treatment plan prescribed today were discussed, and the patient expressed understanding of the given instructions. Patient is instructed to call or message via MyChart if he/she has any questions or concerns regarding our treatment plan. No barriers to understanding were identified. We discussed Red Flag symptoms and signs in detail. Patient expressed understanding regarding what to do in case of urgent or emergency type symptoms.   Medication list was reconciled, printed and provided to the patient in AVS. Patient instructions and summary information was reviewed with the patient as documented in the AVS. This note was prepared with assistance of Dragon voice recognition software. Occasional wrong-word or sound-a-like substitutions may have occurred due to the inherent limitations of voice recognition software

## 2019-04-21 NOTE — Patient Instructions (Addendum)
Please return in 12 months for your annual complete physical; please come fasting.  I will release your lab results to you on your MyChart account with further instructions. Please reply with any questions.   I have ordered a mammogram. They will call you to get you scheduled.   If you have any questions or concerns, please don't hesitate to send me a message via MyChart or call the office at [phone]. Thank you for visiting with [place] today! It's our pleasure caring for you.   Preventive Care 46-53 Years Old, Female Preventive care refers to visits with your health care provider and lifestyle choices that can promote health and wellness. This includes:  A yearly physical exam. This may also be called an annual well check.  Regular dental visits and eye exams.  Immunizations.  Screening for certain conditions.  Healthy lifestyle choices, such as eating a healthy diet, getting regular exercise, not using drugs or products that contain nicotine and tobacco, and limiting alcohol use. What can I expect for my preventive care visit? Physical exam Your health care provider will check your:  Height and weight. This may be used to calculate body mass index (BMI), which tells if you are at a healthy weight.  Heart rate and blood pressure.  Skin for abnormal spots. Counseling Your health care provider may ask you questions about your:  Alcohol, tobacco, and drug use.  Emotional well-being.  Home and relationship well-being.  Sexual activity.  Eating habits.  Work and work Statistician.  Method of birth control.  Menstrual cycle.  Pregnancy history. What immunizations do I need?  Influenza (flu) vaccine  This is recommended every year. Tetanus, diphtheria, and pertussis (Tdap) vaccine  You may need a Td booster every 10 years. Varicella (chickenpox) vaccine  You may need this if you have not been vaccinated. Zoster (shingles) vaccine  You may need this after age  46. Measles, mumps, and rubella (MMR) vaccine  You may need at least one dose of MMR if you were born in 1957 or later. You may also need a second dose. Pneumococcal conjugate (PCV13) vaccine  You may need this if you have certain conditions and were not previously vaccinated. Pneumococcal polysaccharide (PPSV23) vaccine  You may need one or two doses if you smoke cigarettes or if you have certain conditions. Meningococcal conjugate (MenACWY) vaccine  You may need this if you have certain conditions. Hepatitis A vaccine  You may need this if you have certain conditions or if you travel or work in places where you may be exposed to hepatitis A. Hepatitis B vaccine  You may need this if you have certain conditions or if you travel or work in places where you may be exposed to hepatitis B. Haemophilus influenzae type b (Hib) vaccine  You may need this if you have certain conditions. Human papillomavirus (HPV) vaccine  If recommended by your health care provider, you may need three doses over 6 months. You may receive vaccines as individual doses or as more than one vaccine together in one shot (combination vaccines). Talk with your health care provider about the risks and benefits of combination vaccines. What tests do I need? Blood tests  Lipid and cholesterol levels. These may be checked every 5 years, or more frequently if you are over 55 years old.  Hepatitis C test.  Hepatitis B test. Screening  Lung cancer screening. You may have this screening every year starting at age 46 if you have a 30-pack-year history of  smoking and currently smoke or have quit within the past 15 years.  Colorectal cancer screening. All adults should have this screening starting at age 46 and continuing until age 58. Your health care provider may recommend screening at age 60 if you are at increased risk. You will have tests every 1-10 years, depending on your results and the type of screening test.   Diabetes screening. This is done by checking your blood sugar (glucose) after you have not eaten for a while (fasting). You may have this done every 1-3 years.  Mammogram. This may be done every 1-2 years. Talk with your health care provider about when you should start having regular mammograms. This may depend on whether you have a family history of breast cancer.  BRCA-related cancer screening. This may be done if you have a family history of breast, ovarian, tubal, or peritoneal cancers.  Pelvic exam and Pap test. This may be done every 3 years starting at age 46. Starting at age 15, this may be done every 5 years if you have a Pap test in combination with an HPV test. Other tests  Sexually transmitted disease (STD) testing.  Bone density scan. This is done to screen for osteoporosis. You may have this scan if you are at high risk for osteoporosis. Follow these instructions at home: Eating and drinking  Eat a diet that includes fresh fruits and vegetables, whole grains, lean protein, and low-fat dairy.  Take vitamin and mineral supplements as recommended by your health care provider.  Do not drink alcohol if: ? Your health care provider tells you not to drink. ? You are pregnant, may be pregnant, or are planning to become pregnant.  If you drink alcohol: ? Limit how much you have to 0-1 drink a day. ? Be aware of how much alcohol is in your drink. In the [place] one drink equals one 12 oz bottle of beer (355 mL), one 5 oz glass of wine (148 mL), or one 1 oz glass of hard liquor (44 mL). Lifestyle  Take daily care of your teeth and gums.  Stay active. Exercise for at least 30 minutes on 5 or more days each week.  Do not use any products that contain nicotine or tobacco, such as cigarettes, e-cigarettes, and chewing tobacco. If you need help quitting, ask your health care provider.  If you are sexually active, practice safe sex. Use a condom or other form of birth control  (contraception) in order to prevent pregnancy and STIs (sexually transmitted infections).  If told by your health care provider, take low-dose aspirin daily starting at age 53. What's next?  Visit your health care provider once a year for a well check visit.  Ask your health care provider how often you should have your eyes and teeth checked.  Stay up to date on all vaccines. This information is not intended to replace advice given to you by your health care provider. Make sure you discuss any questions you have with your health care provider. Document Released: 06/18/2015 Document Revised: 01/31/2018 Document Reviewed: 01/31/2018 Elsevier Patient Education  2020 [employer].

## 2019-04-23 LAB — CERVICOVAGINAL ANCILLARY ONLY
Bacterial Vaginitis (gardnerella): POSITIVE — AB
Candida Glabrata: NEGATIVE
Candida Vaginitis: NEGATIVE
Comment: NEGATIVE
Comment: NEGATIVE
Comment: NEGATIVE

## 2019-04-23 LAB — CYTOLOGY - PAP
Adequacy: ABSENT
Comment: NEGATIVE
Diagnosis: NEGATIVE
HPV 16: NEGATIVE
HPV 18 / 45: NEGATIVE
High risk HPV: POSITIVE — AB

## 2019-04-24 ENCOUNTER — Telehealth: Payer: Self-pay | Admitting: Family Medicine

## 2019-04-24 ENCOUNTER — Encounter: Payer: Self-pay | Admitting: Family Medicine

## 2019-04-24 DIAGNOSIS — R87810 Cervical high risk human papillomavirus (HPV) DNA test positive: Secondary | ICD-10-CM | POA: Insufficient documentation

## 2019-04-24 MED ORDER — METRONIDAZOLE 500 MG PO TABS: 500.0000 mg | ORAL_TABLET | Freq: Two times a day (BID) | ORAL | 0 refills | Status: AC

## 2019-04-24 NOTE — Addendum Note (Signed)
Addended by: Billey Chang on: 04/24/2019 12:16 PM   Modules accepted: Orders

## 2019-04-24 NOTE — Telephone Encounter (Signed)
Please see result note 

## 2019-04-24 NOTE — Telephone Encounter (Signed)
See note  Copied from [place] [id number]. Topic: General - Other >> Apr 24, 2019  1:53 PM Carolyn Stare wrote: Pt rec a mychart  message about her Pap and would like a call back

## 2019-04-25 ENCOUNTER — Telehealth: Payer: Self-pay | Admitting: Family Medicine

## 2019-04-25 NOTE — Progress Notes (Signed)
Spoke with patient and answered all of her questions regarding her pap test results.

## 2019-04-25 NOTE — Telephone Encounter (Signed)
Spoke with patient: answered all of her questions.

## 2019-04-29 ENCOUNTER — Ambulatory Visit: Payer: 59 | Admitting: Skilled Nursing Facility1

## 2019-06-18 ENCOUNTER — Ambulatory Visit: Payer: 59

## 2019-06-18 ENCOUNTER — Other Ambulatory Visit: Payer: Self-pay | Admitting: Family Medicine

## 2019-06-18 DIAGNOSIS — Z1231 Encounter for screening mammogram for malignant neoplasm of breast: Secondary | ICD-10-CM

## 2019-07-29 ENCOUNTER — Other Ambulatory Visit: Payer: Self-pay

## 2019-07-29 ENCOUNTER — Ambulatory Visit
Admission: RE | Admit: 2019-07-29 | Discharge: 2019-07-29 | Disposition: A | Discharge status: Home / Self Care | Payer: 59 | Source: Ambulatory Visit | Attending: Family Medicine | Admitting: Family Medicine

## 2019-07-29 DIAGNOSIS — Z1231 Encounter for screening mammogram for malignant neoplasm of breast: Secondary | ICD-10-CM

## 2020-03-11 ENCOUNTER — Encounter (HOSPITAL_COMMUNITY): Payer: Self-pay

## 2020-08-09 IMAGING — MG DIGITAL SCREENING BILAT W/ TOMO W/ CAD
8 series · 9 of 24 positions shown · non-contrast
Comparison: Previous exam(s).

CLINICAL DATA: Screening.

EXAM:
DIGITAL SCREENING BILATERAL MAMMOGRAM WITH TOMO AND CAD

[R CC synth-2D]
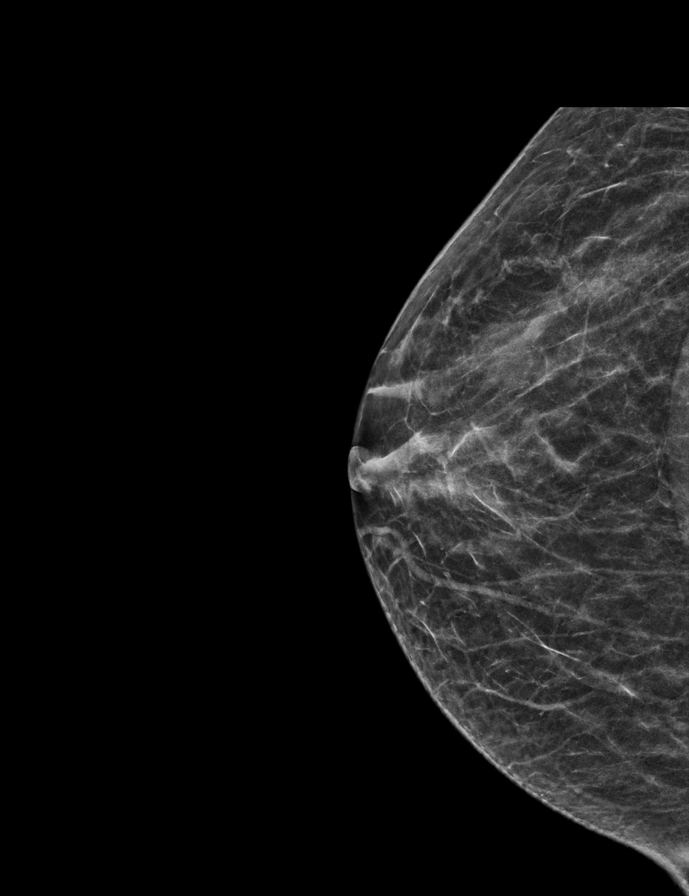

[R MLO synth-2D]
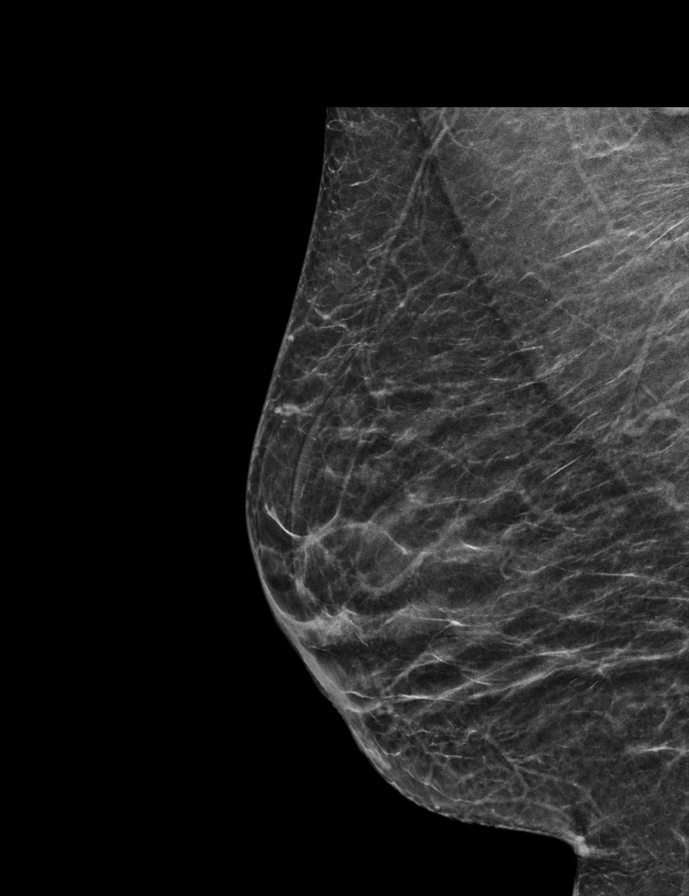

[L MLO synth-2D]
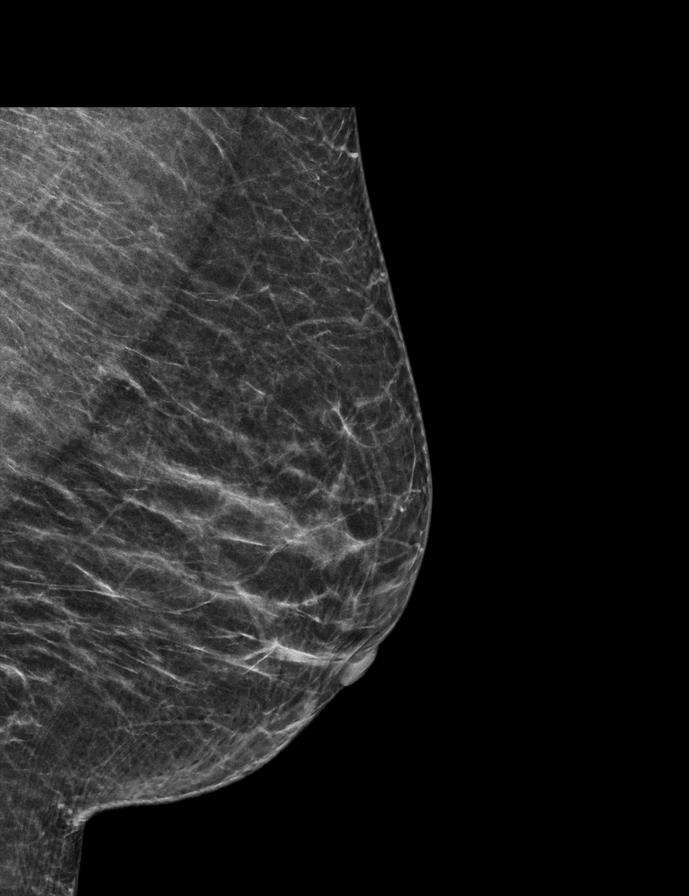

[L CC synth-2D]
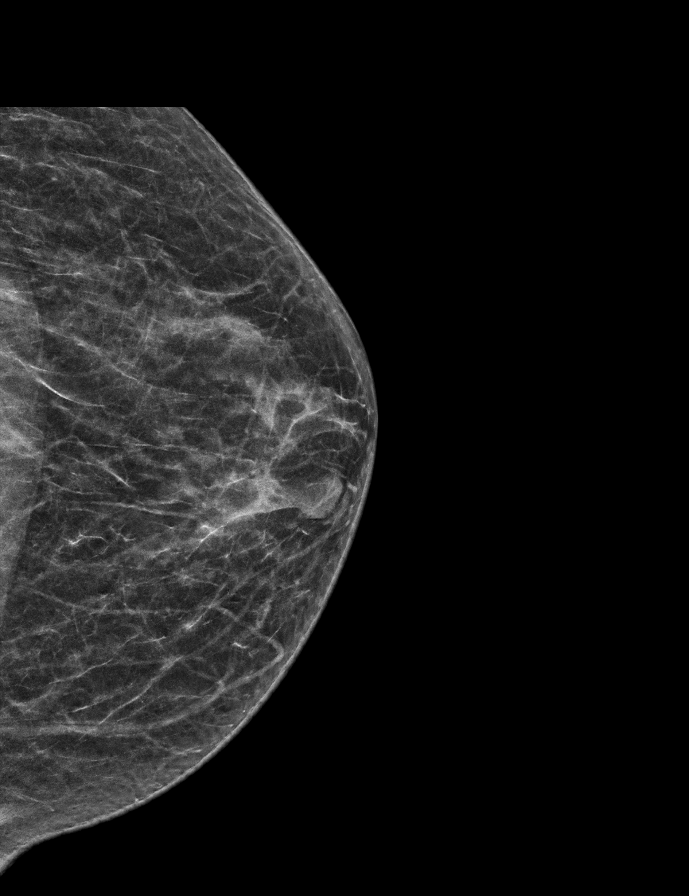

[R MLO tomo · 2 of 46 frames shown]
[frame 15/46]
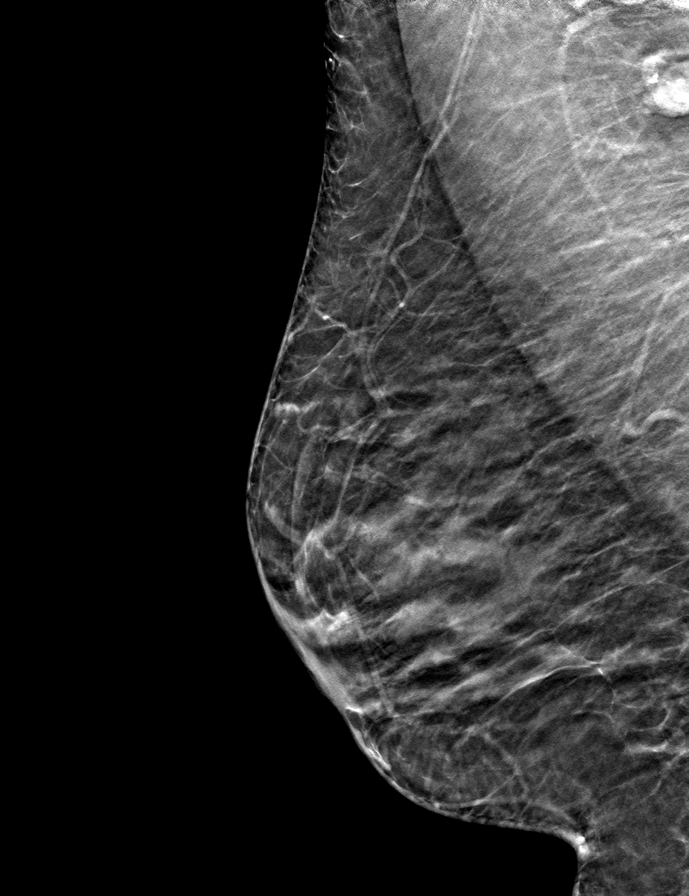
[frame 23/46]
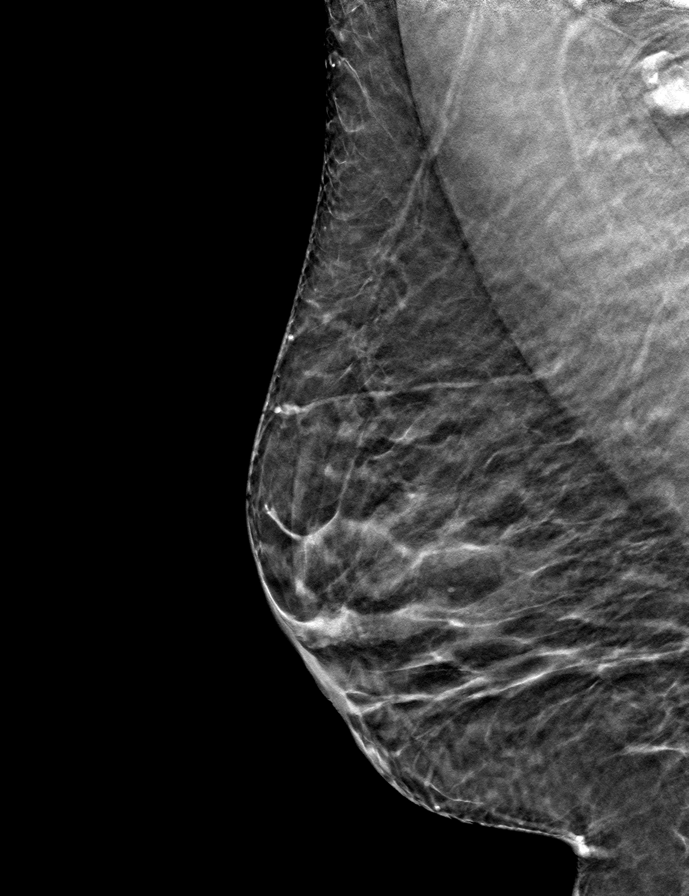

[L CC tomo · tomo slice 25/48.0]
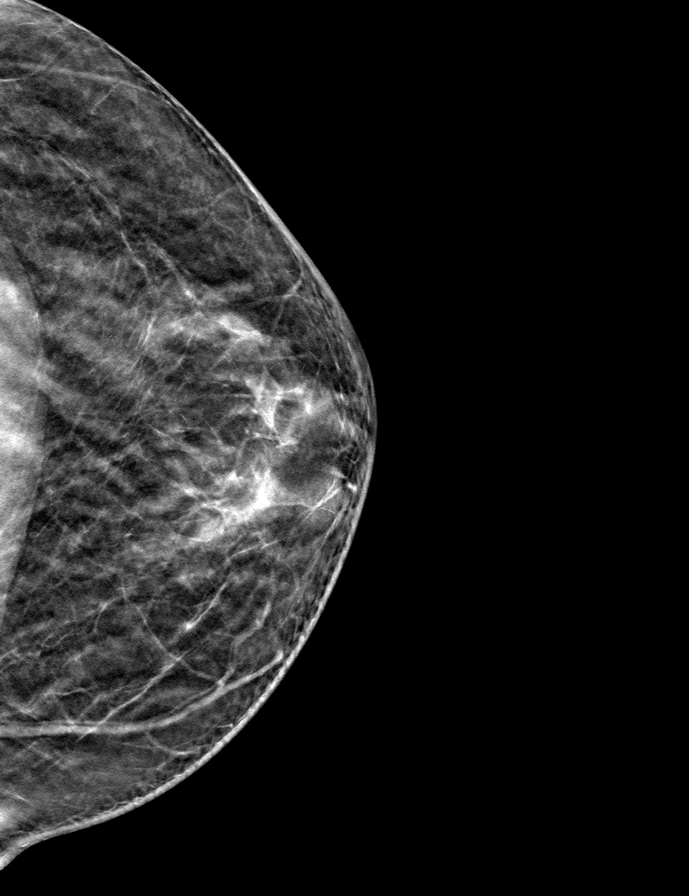

[R CC tomo · tomo slice 23/45.0]
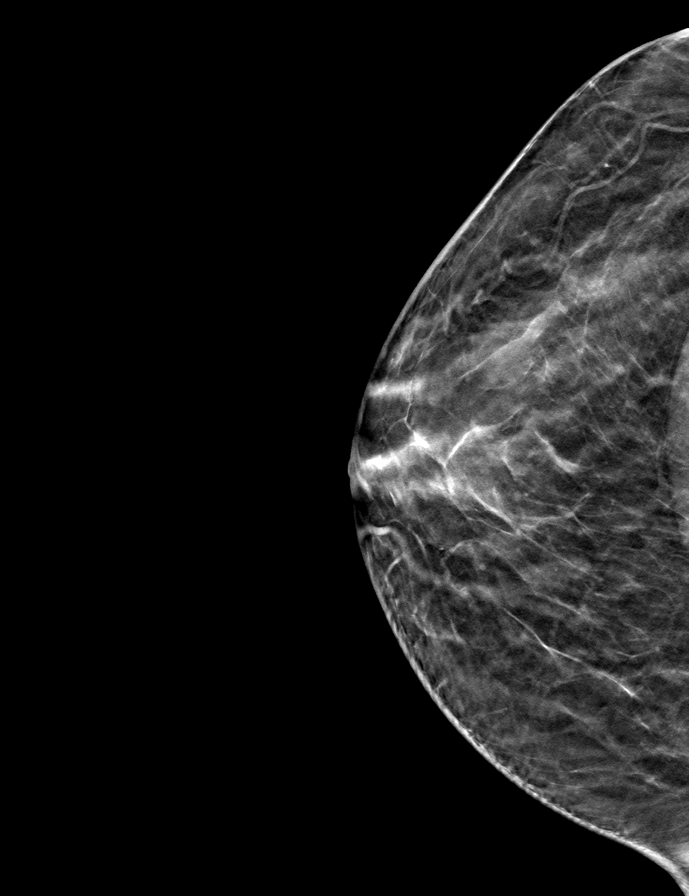

[L MLO tomo · tomo slice 25/48.0]
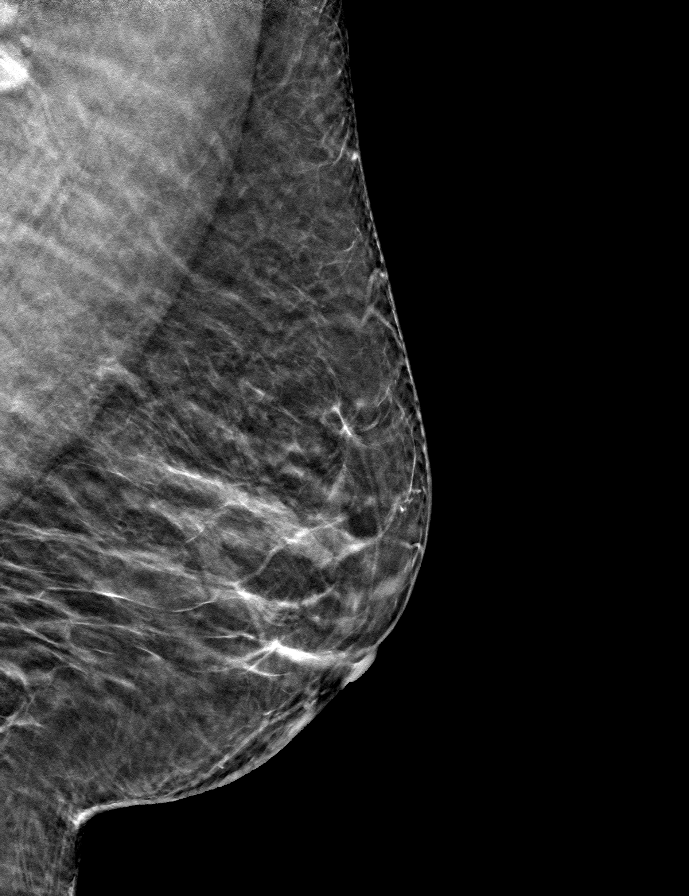

[9 of 24 positions shown; findings below may reference images not displayed]

ACR Breast Density Category b: There are scattered areas of
fibroglandular density.
FINDINGS: There are no findings suspicious for malignancy. Images were
processed with CAD.
IMPRESSION: No mammographic evidence of malignancy. A result letter of this
screening mammogram will be mailed directly to the patient.

RECOMMENDATION:
Screening mammogram in one year. (Code:[id number])

BI-RADS CATEGORY  1: Negative.

## 2021-03-11 ENCOUNTER — Encounter (HOSPITAL_COMMUNITY): Payer: Self-pay | Admitting: *Deleted

## 2022-02-17 ENCOUNTER — Encounter (HOSPITAL_COMMUNITY): Payer: Self-pay | Admitting: *Deleted

## 2022-02-27 ENCOUNTER — Encounter: Payer: Self-pay | Admitting: *Deleted

## 2022-04-11 ENCOUNTER — Encounter: Payer: Self-pay | Admitting: Internal Medicine

## 2022-05-18 ENCOUNTER — Encounter: Payer: Self-pay | Admitting: *Deleted

## 2023-03-08 ENCOUNTER — Encounter (HOSPITAL_COMMUNITY): Payer: Self-pay | Admitting: *Deleted
